# Patient Record
Sex: Female | Born: 1940 | Race: White | Hispanic: No | Marital: Married | Smoking: Former smoker
Health system: Southern US, Community
[De-identification: ages and names within clinical notes are randomized; demographics above are authoritative.]

## PROBLEM LIST (undated history)

## (undated) DIAGNOSIS — E785 Hyperlipidemia, unspecified: Secondary | ICD-10-CM

## (undated) DIAGNOSIS — I10 Essential (primary) hypertension: Secondary | ICD-10-CM

## (undated) DIAGNOSIS — R269 Unspecified abnormalities of gait and mobility: Secondary | ICD-10-CM

## (undated) DIAGNOSIS — R270 Ataxia, unspecified: Secondary | ICD-10-CM

## (undated) DIAGNOSIS — M1990 Unspecified osteoarthritis, unspecified site: Secondary | ICD-10-CM

## (undated) DIAGNOSIS — F482 Pseudobulbar affect: Secondary | ICD-10-CM

## (undated) DIAGNOSIS — F419 Anxiety disorder, unspecified: Secondary | ICD-10-CM

## (undated) DIAGNOSIS — T7840XA Allergy, unspecified, initial encounter: Secondary | ICD-10-CM

## (undated) DIAGNOSIS — R011 Cardiac murmur, unspecified: Secondary | ICD-10-CM

## (undated) DIAGNOSIS — G709 Myoneural disorder, unspecified: Secondary | ICD-10-CM

## (undated) HISTORY — PX: BREAST SURGERY: SHX581

## (undated) HISTORY — DX: Myoneural disorder, unspecified: G70.9

## (undated) HISTORY — DX: Anxiety disorder, unspecified: F41.9

## (undated) HISTORY — DX: Hyperlipidemia, unspecified: E78.5

## (undated) HISTORY — DX: Unspecified osteoarthritis, unspecified site: M19.90

## (undated) HISTORY — DX: Pseudobulbar affect: F48.2

## (undated) HISTORY — PX: CHOLECYSTECTOMY: SHX55

## (undated) HISTORY — DX: Cardiac murmur, unspecified: R01.1

## (undated) HISTORY — DX: Allergy, unspecified, initial encounter: T78.40XA

## (undated) HISTORY — PX: GALLBLADDER SURGERY: SHX652

## (undated) HISTORY — DX: Ataxia, unspecified: R27.0

## (undated) HISTORY — DX: Unspecified abnormalities of gait and mobility: R26.9

## (undated) HISTORY — DX: Essential (primary) hypertension: I10

---

## 1998-05-03 ENCOUNTER — Other Ambulatory Visit: Admission: RE | Admit: 1998-05-03 | Discharge: 1998-05-03 | Payer: Self-pay | Admitting: Obstetrics and Gynecology

## 1998-06-08 ENCOUNTER — Other Ambulatory Visit: Admission: RE | Admit: 1998-06-08 | Discharge: 1998-06-08 | Payer: Self-pay | Admitting: Radiology

## 1999-04-25 ENCOUNTER — Other Ambulatory Visit: Admission: RE | Admit: 1999-04-25 | Discharge: 1999-04-25 | Payer: Self-pay | Admitting: Obstetrics and Gynecology

## 2000-08-11 ENCOUNTER — Other Ambulatory Visit: Admission: RE | Admit: 2000-08-11 | Discharge: 2000-08-11 | Payer: Self-pay | Admitting: Family Medicine

## 2001-08-12 ENCOUNTER — Other Ambulatory Visit: Admission: RE | Admit: 2001-08-12 | Discharge: 2001-08-12 | Payer: Self-pay | Admitting: Family Medicine

## 2001-09-02 LAB — FECAL OCCULT BLOOD, GUAIAC: Fecal Occult Blood: NEGATIVE

## 2002-08-26 ENCOUNTER — Other Ambulatory Visit: Admission: RE | Admit: 2002-08-26 | Discharge: 2002-08-26 | Payer: Self-pay | Admitting: Obstetrics and Gynecology

## 2002-09-01 LAB — HM DEXA SCAN: HM Dexa Scan: NORMAL

## 2003-10-03 ENCOUNTER — Other Ambulatory Visit: Admission: RE | Admit: 2003-10-03 | Discharge: 2003-10-03 | Payer: Self-pay | Admitting: Family Medicine

## 2003-11-08 LAB — HM COLONOSCOPY: HM Colonoscopy: NORMAL

## 2004-12-11 ENCOUNTER — Other Ambulatory Visit: Admission: RE | Admit: 2004-12-11 | Discharge: 2004-12-11 | Payer: Self-pay | Admitting: Family Medicine

## 2004-12-11 ENCOUNTER — Ambulatory Visit: Payer: Self-pay | Admitting: Family Medicine

## 2005-03-21 ENCOUNTER — Ambulatory Visit: Payer: Self-pay | Admitting: Family Medicine

## 2005-07-23 ENCOUNTER — Ambulatory Visit: Payer: Self-pay | Admitting: Family Medicine

## 2006-04-09 ENCOUNTER — Ambulatory Visit: Payer: Self-pay | Admitting: Family Medicine

## 2006-04-16 ENCOUNTER — Ambulatory Visit: Payer: Self-pay | Admitting: Family Medicine

## 2006-05-09 ENCOUNTER — Ambulatory Visit: Payer: Self-pay | Admitting: Family Medicine

## 2006-06-23 ENCOUNTER — Ambulatory Visit: Payer: Self-pay | Admitting: Family Medicine

## 2006-08-05 ENCOUNTER — Ambulatory Visit: Payer: Self-pay | Admitting: Family Medicine

## 2006-10-09 ENCOUNTER — Ambulatory Visit: Payer: Self-pay | Admitting: Family Medicine

## 2006-10-28 ENCOUNTER — Ambulatory Visit: Payer: Self-pay | Admitting: Family Medicine

## 2006-11-06 ENCOUNTER — Encounter: Payer: Self-pay | Admitting: Family Medicine

## 2006-12-17 ENCOUNTER — Ambulatory Visit: Payer: Self-pay | Admitting: Family Medicine

## 2006-12-17 LAB — CONVERTED CEMR LAB
Cholesterol: 263 mg/dL (ref 0–200)
HDL: 59.6 mg/dL (ref >39.0)
Total CHOL/HDL Ratio: 4.4
Triglycerides: 121 mg/dL (ref 0–149)
VLDL: 24 mg/dL (ref 0–40)

## 2007-03-16 ENCOUNTER — Encounter: Payer: Self-pay | Admitting: Family Medicine

## 2007-03-16 DIAGNOSIS — I10 Essential (primary) hypertension: Secondary | ICD-10-CM | POA: Insufficient documentation

## 2007-03-16 DIAGNOSIS — J309 Allergic rhinitis, unspecified: Secondary | ICD-10-CM | POA: Insufficient documentation

## 2007-03-16 DIAGNOSIS — D259 Leiomyoma of uterus, unspecified: Secondary | ICD-10-CM | POA: Insufficient documentation

## 2007-03-16 DIAGNOSIS — Z8679 Personal history of other diseases of the circulatory system: Secondary | ICD-10-CM | POA: Insufficient documentation

## 2007-03-16 DIAGNOSIS — N6019 Diffuse cystic mastopathy of unspecified breast: Secondary | ICD-10-CM

## 2007-03-16 DIAGNOSIS — E7800 Pure hypercholesterolemia, unspecified: Secondary | ICD-10-CM

## 2007-04-28 ENCOUNTER — Ambulatory Visit: Payer: Self-pay | Admitting: Family Medicine

## 2007-04-28 DIAGNOSIS — F329 Major depressive disorder, single episode, unspecified: Secondary | ICD-10-CM

## 2007-06-02 ENCOUNTER — Ambulatory Visit: Payer: Self-pay | Admitting: Family Medicine

## 2007-06-09 LAB — CONVERTED CEMR LAB
Chloride: 110 meq/L (ref 96–112)
Cholesterol: 273 mg/dL (ref 0–200)
GFR calc Af Amer: 109 mL/min
Glucose, Bld: 85 mg/dL (ref 70–99)
HDL: 56.8 mg/dL (ref >39.0)
Phosphorus: 2.7 mg/dL (ref 2.3–4.6)
Sodium: 142 meq/L (ref 135–145)
Total CHOL/HDL Ratio: 4.8
Triglycerides: 137 mg/dL (ref 0–149)
VLDL: 27 mg/dL (ref 0–40)

## 2007-06-11 ENCOUNTER — Encounter: Admission: RE | Admit: 2007-06-11 | Discharge: 2007-08-05 | Payer: Self-pay | Admitting: Family Medicine

## 2007-08-05 ENCOUNTER — Encounter: Payer: Self-pay | Admitting: Family Medicine

## 2007-08-27 ENCOUNTER — Encounter: Payer: Self-pay | Admitting: Family Medicine

## 2007-09-04 ENCOUNTER — Encounter (INDEPENDENT_AMBULATORY_CARE_PROVIDER_SITE_OTHER): Payer: Self-pay | Admitting: *Deleted

## 2008-02-24 ENCOUNTER — Telehealth: Payer: Self-pay | Admitting: Family Medicine

## 2008-06-07 ENCOUNTER — Ambulatory Visit: Payer: Self-pay | Admitting: Family Medicine

## 2008-06-07 DIAGNOSIS — H6120 Impacted cerumen, unspecified ear: Secondary | ICD-10-CM

## 2008-06-15 ENCOUNTER — Encounter: Payer: Self-pay | Admitting: Family Medicine

## 2008-06-29 ENCOUNTER — Ambulatory Visit: Payer: Self-pay | Admitting: Family Medicine

## 2008-06-29 DIAGNOSIS — E079 Disorder of thyroid, unspecified: Secondary | ICD-10-CM | POA: Insufficient documentation

## 2008-07-15 ENCOUNTER — Encounter: Payer: Self-pay | Admitting: Family Medicine

## 2008-07-29 ENCOUNTER — Encounter: Payer: Self-pay | Admitting: Family Medicine

## 2008-08-24 ENCOUNTER — Ambulatory Visit: Payer: Self-pay | Admitting: Family Medicine

## 2008-08-26 LAB — CONVERTED CEMR LAB
AST: 30 units/L (ref 0–37)
Cholesterol: 254 mg/dL (ref 0–200)
Direct LDL: 194.1 mg/dL
Total CHOL/HDL Ratio: 4.7
VLDL: 21 mg/dL (ref 0–40)

## 2008-08-31 ENCOUNTER — Ambulatory Visit: Payer: Self-pay | Admitting: Family Medicine

## 2008-09-19 ENCOUNTER — Encounter: Payer: Self-pay | Admitting: Family Medicine

## 2008-10-04 ENCOUNTER — Ambulatory Visit: Payer: Self-pay | Admitting: Family Medicine

## 2008-10-06 LAB — CONVERTED CEMR LAB
AST: 29 units/L (ref 0–37)
HDL: 52.5 mg/dL (ref >39.0)
Total CHOL/HDL Ratio: 4.4
VLDL: 27 mg/dL (ref 0–40)

## 2008-11-15 ENCOUNTER — Ambulatory Visit: Payer: Self-pay | Admitting: Family Medicine

## 2008-11-15 DIAGNOSIS — M79609 Pain in unspecified limb: Secondary | ICD-10-CM | POA: Insufficient documentation

## 2008-12-06 ENCOUNTER — Ambulatory Visit: Payer: Self-pay | Admitting: Family Medicine

## 2008-12-06 ENCOUNTER — Encounter: Payer: Self-pay | Admitting: Critical Care Medicine

## 2008-12-06 DIAGNOSIS — R0602 Shortness of breath: Secondary | ICD-10-CM | POA: Insufficient documentation

## 2008-12-26 ENCOUNTER — Encounter: Payer: Self-pay | Admitting: Family Medicine

## 2009-01-04 ENCOUNTER — Ambulatory Visit: Payer: Self-pay | Admitting: Family Medicine

## 2009-01-04 DIAGNOSIS — E559 Vitamin D deficiency, unspecified: Secondary | ICD-10-CM

## 2009-01-10 ENCOUNTER — Ambulatory Visit: Payer: Self-pay | Admitting: Critical Care Medicine

## 2009-01-17 ENCOUNTER — Ambulatory Visit: Payer: Self-pay | Admitting: Critical Care Medicine

## 2009-01-18 ENCOUNTER — Encounter: Payer: Self-pay | Admitting: Critical Care Medicine

## 2009-01-19 ENCOUNTER — Encounter: Payer: Self-pay | Admitting: Critical Care Medicine

## 2009-04-28 ENCOUNTER — Telehealth: Payer: Self-pay | Admitting: Family Medicine

## 2009-08-29 ENCOUNTER — Ambulatory Visit: Payer: Self-pay | Admitting: Family Medicine

## 2009-08-30 LAB — CONVERTED CEMR LAB
Albumin: 4.1 g/dL (ref 3.5–5.2)
Calcium: 9.9 mg/dL (ref 8.4–10.5)
Chloride: 106 meq/L (ref 96–112)
Cholesterol: 222 mg/dL — ABNORMAL HIGH (ref 0–200)
Direct LDL: 154.9 mg/dL
Free T4: 0.6 ng/dL (ref 0.6–1.6)
Potassium: 4 meq/L (ref 3.5–5.1)
Triglycerides: 173 mg/dL — ABNORMAL HIGH (ref 0.0–149.0)

## 2009-09-03 ENCOUNTER — Inpatient Hospital Stay: Payer: Self-pay | Admitting: Surgery

## 2009-09-06 ENCOUNTER — Telehealth: Payer: Self-pay | Admitting: Family Medicine

## 2009-09-20 ENCOUNTER — Encounter: Payer: Self-pay | Admitting: Family Medicine

## 2009-09-26 ENCOUNTER — Encounter: Payer: Self-pay | Admitting: Family Medicine

## 2009-10-02 ENCOUNTER — Encounter: Payer: Self-pay | Admitting: Family Medicine

## 2010-08-28 ENCOUNTER — Ambulatory Visit: Payer: Self-pay | Admitting: Family Medicine

## 2010-08-29 LAB — CONVERTED CEMR LAB
ALT: 28 units/L (ref 0–35)
BUN: 17 mg/dL (ref 6–23)
Basophils Relative: 0.4 % (ref 0.0–3.0)
CO2: 24 meq/L (ref 19–32)
Calcium: 9.7 mg/dL (ref 8.4–10.5)
Cholesterol: 234 mg/dL — ABNORMAL HIGH (ref 0–200)
Creatinine, Ser: 0.8 mg/dL (ref 0.4–1.2)
Direct LDL: 155.2 mg/dL
Eosinophils Relative: 1.6 % (ref 0.0–5.0)
HCT: 41.3 % (ref 36.0–46.0)
Hemoglobin: 13.8 g/dL (ref 12.0–15.0)
Lymphs Abs: 2.8 10*3/uL (ref 0.7–4.0)
MCV: 91.8 fL (ref 78.0–100.0)
Monocytes Absolute: 0.7 10*3/uL (ref 0.1–1.0)
Neutro Abs: 4.7 10*3/uL (ref 1.4–7.7)
Platelets: 240 10*3/uL (ref 150.0–400.0)
Sodium: 140 meq/L (ref 135–145)
TSH: 3.48 microintl units/mL (ref 0.35–5.50)
Total Bilirubin: 0.6 mg/dL (ref 0.3–1.2)
Total Protein: 7.2 g/dL (ref 6.0–8.3)
Triglycerides: 162 mg/dL — ABNORMAL HIGH (ref 0.0–149.0)
Vit D, 25-Hydroxy: 58 ng/mL (ref 30–89)
WBC: 8.3 10*3/uL (ref 4.5–10.5)

## 2010-09-20 ENCOUNTER — Encounter: Payer: Self-pay | Admitting: Family Medicine

## 2010-09-25 ENCOUNTER — Encounter: Payer: Self-pay | Admitting: Family Medicine

## 2010-09-25 LAB — HM MAMMOGRAPHY: HM Mammogram: NORMAL

## 2010-09-28 ENCOUNTER — Encounter: Payer: Self-pay | Admitting: Family Medicine

## 2010-12-30 LAB — CONVERTED CEMR LAB
Basophils Absolute: 0 10*3/uL (ref 0.0–0.1)
Eosinophils Absolute: 0.2 10*3/uL (ref 0.0–0.7)
MCHC: 34.4 g/dL (ref 30.0–36.0)
MCV: 91.2 fL (ref 78.0–100.0)
Neutrophils Relative %: 51.8 % (ref 43.0–77.0)
Platelets: 278 10*3/uL (ref 150–400)
WBC: 7.3 10*3/uL (ref 4.5–10.5)

## 2011-01-01 NOTE — Assessment & Plan Note (Signed)
Summary: F/U,REFILL MEDICATIONS/CLE   Vital Signs:  Patient profile:   70 year old female Height:      64 inches Weight:      189.25 pounds BMI:     32.60 Temp:     97.9 degrees F oral Pulse rate:   76 / minute Pulse rhythm:   regular BP sitting:   132 / 76  (left arm) Cuff size:   regular  Vitals Entered By: Lewanda Rife LPN (August 28, 2010 9:19 AM) CC: refill meds   History of Present Illness: here for f/u of HTN and low D and thyroid and lipids   is feeling pretty good  neurologic process is progressive  is able to walk with a cane -- no walker yet  thinks she probably needs one -- does occ fall  especially getting out of house   went to [facility] -- they offered nothing in terms of her condition   wt is up 1 lb  132/76 bp today   on welchol- cannot have statins last LDL 150s- needs re check  remote hx abn tsh  has mail order form for her meds   is cooking more- may be bad for cholesterol     Allergies (verified): No Known Drug Allergies  Past History:  Past Medical History: Last updated: 08/31/2008 Allergic rhinitis-seasonal Hypertension degenerative cerebellar ataxia hyperlipidemia  neurology---Dr Hollice Espy ([place]), Dr Orlin Hilding  Past Surgical History: Last updated: 10/04/2009 Left breast needle biopsy- neg. Dexa- normal (1999), Dexa- normal (09/2002) Carotid US- normal (09/2003) Colonoscopy- hemorrhoids, otherwise neg. (11/2003) NCV/EMG- normal (09/2005) 2D Echo- neg. (04/2006) 10/10 CCY  Family History: Last updated: 01/10/2009 father CAD mother COPD, breast ca daughter- depression MGM with CHF  Family History Lung Cancer father  Social History: Last updated: 08/28/2010 married Patient is a former smoker.  no alcohol  has an exercise bike-- does not use it often  retired from [employer] enjoys cooking  Risk Factors: Smoking Status: quit (03/16/2007)  Social History: married Patient is a former smoker.  no  alcohol  has an exercise bike-- does not use it often  retired from [employer] enjoys cooking  Review of Systems General:  Complains of fatigue; denies fever, loss of appetite, and malaise. Eyes:  Denies blurring and eye irritation. CV:  Denies chest pain or discomfort and palpitations. Resp:  Denies cough, shortness of breath, and wheezing. GI:  Denies abdominal pain, bloody stools, change in bowel habits, and indigestion. GU:  Denies dysuria and urinary frequency. MS:  Complains of muscle weakness and stiffness; denies cramps. Derm:  Denies itching, lesion(s), poor wound healing, and rash. Neuro:  Denies numbness and tingling. Psych:  mood is fair . Endo:  Denies cold intolerance, excessive thirst, excessive urination, and heat intolerance. Heme:  Denies abnormal bruising and bleeding.  Physical Exam  General:  overweight but generally well appearing  Head:  normocephalic, atraumatic, and no abnormalities observed.   Eyes:  vision grossly intact, pupils equal, pupils round, and pupils reactive to light.  no conjunctival pallor, injection or icterus  Ears:  R ear normal and L ear normal.   Nose:  no nasal discharge.   Mouth:  pharynx pink and moist.   Neck:  supple with full rom and no masses or thyromegally, no JVD or carotid bruit  Chest Wall:  No deformities, masses, or tenderness noted. Lungs:  Normal respiratory effort, chest expands symmetrically. Lungs are clear to auscultation, no crackles or wheezes. Heart:  Normal rate and regular  rhythm. S1 and S2 normal without gallop, murmur, click, rub or other extra sounds. Abdomen:  Bowel sounds positive,abdomen soft and non-tender without masses, organomegaly or hernias noted. no renal bruits  Msk:  No deformity or scoliosis noted of thoracic or lumbar spine.  no acute joint changes  Pulses:  pulses normal Extremities:  no clubbing, cyanosis, edema, or deformity noted Neurologic:  some speech slurring today general ataxia  - with cane  no tremor noted  Skin:  Intact without suspicious lesions or rashes Cervical Nodes:  No lymphadenopathy noted Inguinal Nodes:  No significant adenopathy Psych:  normal affect, talkative and pleasant    Impression & Recommendations:  Problem # 1:  THYROID STIMULATING HORMONE, ABNORMAL (ICD-246.9) Assessment Unchanged re check tsh- last few have been nl  no clinical change Orders: Venipuncture (81191) TLB-Lipid Panel (80061-LIPID) TLB-Renal Function Panel (80069-RENAL) TLB-CBC Platelet - w/Differential (85025-CBCD) TLB-Hepatic/Liver Function Pnl (80076-HEPATIC) TLB-TSH (Thyroid Stimulating Hormone) (84443-TSH) T-Vitamin D (25-Hydroxy) (47829-56213)  Problem # 2:  UNSPECIFIED VITAMIN D DEFICIENCY (ICD-268.9) Assessment: Unchanged check level on current dose Orders: Venipuncture (08657) TLB-Lipid Panel (80061-LIPID) TLB-Renal Function Panel (80069-RENAL) TLB-CBC Platelet - w/Differential (85025-CBCD) TLB-Hepatic/Liver Function Pnl (80076-HEPATIC) TLB-TSH (Thyroid Stimulating Hormone) (84443-TSH) T-Vitamin D (25-Hydroxy) (84696-29528) Specimen Handling (41324)  Problem # 3:  ATAXIA, 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) Assessment: Deteriorated continue neuro f/u needs a walker for safety  Problem # 4:  Hx of HYPERCHOLESTEROLEMIA (ICD-272.0) Assessment: Unchanged  may be up due to cooking more rev low sat fat diet lab today Her updated medication list for this problem includes:    Welchol 625 Mg Tabs (Colesevelam hcl) .Marland KitchenMarland KitchenMarland KitchenMarland Kitchen 3 by mouth two times a day as directed  Orders: Venipuncture (40102) TLB-Lipid Panel (80061-LIPID) TLB-Renal Function Panel (80069-RENAL) TLB-CBC Platelet - w/Differential (85025-CBCD) TLB-Hepatic/Liver Function Pnl (80076-HEPATIC) TLB-TSH (Thyroid Stimulating Hormone) (84443-TSH) T-Vitamin D (25-Hydroxy) (72536-64403)  Labs Reviewed: SGOT: 29 (08/29/2009)   SGPT: 38 (08/29/2009)   HDL:50.30 (08/29/2009), 52.5 (10/04/2008)  LDL:153  (10/04/2008), DEL (08/24/2008)  Chol:222 (08/29/2009), 232 (10/04/2008)  Trig:173.0 (08/29/2009), 133 (10/04/2008)  Problem # 5:  HYPERTENSION (ICD-401.9) Assessment: Unchanged  bp is good - no change in med  Her updated medication list for this problem includes:    Hydrochlorothiazide 25 Mg Tabs (Hydrochlorothiazide) .Marland Kitchen... 1 by mouth once daily    Toprol Xl 50 Mg Tb24 (Metoprolol succinate) .Marland Kitchen... 1/2 by mouth once daily  Orders: Venipuncture (47425) TLB-Lipid Panel (80061-LIPID) TLB-Renal Function Panel (80069-RENAL) TLB-CBC Platelet - w/Differential (85025-CBCD) TLB-Hepatic/Liver Function Pnl (80076-HEPATIC) TLB-TSH (Thyroid Stimulating Hormone) (84443-TSH) T-Vitamin D (25-Hydroxy) (95638-75643)  BP today: 132/76 Prior BP: 130/90 (08/29/2009)  Labs Reviewed: K+: 4.0 (08/29/2009) Creat: : 0.8 (08/29/2009)   Chol: 222 (08/29/2009)   HDL: 50.30 (08/29/2009)   LDL: 153 (10/04/2008)   TG: 173.0 (08/29/2009)  Complete Medication List: 1)  Hydrochlorothiazide 25 Mg Tabs (Hydrochlorothiazide) .Marland Kitchen.. 1 by mouth once daily 2)  Toprol Xl 50 Mg Tb24 (Metoprolol succinate) .... 1/2 by mouth once daily 3)  Adult Aspirin Low Strength 81 Mg Tbdp (Aspirin) .... As needed 4)  Flax Seed Oil 1000 Mg Caps (Flaxseed (linseed)) .... Two capsules by mouth daily 5)  Welchol 625 Mg Tabs (Colesevelam hcl) .... 3 by mouth two times a day as directed 6)  Vitamin D 1000 Unit Tabs (Cholecalciferol) .... Take 1 tablet by mouth twice a day  Patient Instructions: 1)  ask your neurologist if it is safe for you to get a flu shot  2)  refilling meds  3)  labs today  4)  try  to stay active - I think a walker would increase your independence and ability to do things as well as speed  Prescriptions: WELCHOL 625 MG TABS (COLESEVELAM HCL) 3 by mouth two times a day as directed  # 3 months x 3   Entered and Authorized by:   Judith Part MD   Signed by:   Judith Part MD on 08/28/2010   Method used:    Historical   RxID:   [id number] TOPROL XL 50 MG  TB24 (METOPROLOL SUCCINATE) 1/2 by mouth once daily  #45 x 3   Entered and Authorized by:   Judith Part MD   Signed by:   Judith Part MD on 08/28/2010   Method used:   Historical   RxID:   [id number] HYDROCHLOROTHIAZIDE 25 MG TABS (HYDROCHLOROTHIAZIDE) 1 by mouth once daily  #90 x 3   Entered and Authorized by:   Judith Part MD   Signed by:   Judith Part MD on 08/28/2010   Method used:   Historical   RxID:   [id number]   Current Allergies (reviewed today): No known allergies

## 2011-01-01 NOTE — Medication Information (Signed)
Summary: Mail Order Rx's For Welchol, HCTZ, Toprol XL  Mail Order Rx's For Welchol, HCTZ, Toprol XL   Imported By: Maryln Gottron 09/03/2010 11:15:36  _____________________________________________________________________  External Attachment:    Type:   Image     Comment:   External Document

## 2011-01-01 NOTE — Letter (Signed)
Summary: Results Follow up Letter  [facility]  [address] [place], [place] [address]   Phone: [phone]  Fax: [phone]    09/28/2010 MRN: [id number]    Jaime Mccarthy [address] [place], [place]  [address]    Dear Ms. Caryn Section,  The following are the results of your recent test(s):  Test         Result    Pap Smear:        Normal _____  Not Normal _____ Comments: ______________________________________________________ Cholesterol: LDL(Bad cholesterol):         Your goal is less than:         HDL (Good cholesterol):       Your goal is more than: Comments:  ______________________________________________________ Mammogram:        Normal __X___  Not Normal _____ Comments:Repeat in one year.    ___________________________________________________________________ Hemoccult:        Normal _____  Not normal _______ Comments:    _____________________________________________________________________ Other Tests:    We routinely do not discuss normal results over the telephone.  If you desire a copy of the results, or you have any questions about this information we can discuss them at your next office visit.   Sincerely,    Idamae Schuller Tower,MD  MT/ri

## 2011-01-01 NOTE — Letter (Signed)
Summary: [facility] @ [facility] [place]   Imported By: Lanelle Bal 09/27/2010 13:07:50  _____________________________________________________________________  External Attachment:    Type:   Image     Comment:   External Document

## 2011-09-10 ENCOUNTER — Telehealth: Payer: Self-pay | Admitting: *Deleted

## 2011-09-10 NOTE — Telephone Encounter (Signed)
Thanks for the heads up -will address at f/u- please make sure pt is feeling ok - no headache or new symptoms thanks

## 2011-09-10 NOTE — Telephone Encounter (Signed)
Tried to call pt and line busy x 2. Will try again.

## 2011-09-10 NOTE — Telephone Encounter (Signed)
Thanks- I will route this to Jamesetta So to see why I got the message so late -- I did not see it until late on 10/9

## 2011-09-10 NOTE — Telephone Encounter (Signed)
Spoke with pt's husband and he said pt is doing OK today. He said speech therapist called our office on 09/09/11. I apologized and explained that note came to Dr Milinda Antis today at 4:05pm. Mr Atha said pt's pressure today diastolic was 80. Pt is not having h/a and feels OK. Pt will keep appt to see Dr Milinda Antis and will call back sooner if needed.

## 2011-09-10 NOTE — Telephone Encounter (Signed)
FYI----Speech therapist called to report that when she was with pt yesterday pt's BP was 140/100, which is unusual for the patient.  She has appt to see you on 10/23.

## 2011-09-13 NOTE — Telephone Encounter (Signed)
Called and spoke to pt husband and when I asked him what day the therapist was out to check wife's BP he first said without hesitation Tuesday and when we started talking about the situation he said it was on Monday.  He said the therapist called from his house and left a message that afternoon.  Seems to be inconsistencies on when the therapist was at the house; however, pt husband is saying it was on Monday.  I will follow up with [facility] also to research what day the therapist was at the home and reported BP.  Husband explained that they continued to monitor blood pressure and that it came down and that his wife was doing ok so they did not call back.  I apologized several times and explained that our office notes calls into the office and calls back and I would research further to assure it did not happen again.    Of note, documentation on chart from nursing staff also is dated on Tuesday and says the therapist called on that day.  I will also follow up with [facility] regarding our communication.

## 2011-09-14 NOTE — Telephone Encounter (Signed)
Thanks -- just need to make sure if there was a delay that it does not happen again

## 2011-09-23 ENCOUNTER — Encounter: Payer: Self-pay | Admitting: Family Medicine

## 2011-09-24 ENCOUNTER — Ambulatory Visit (INDEPENDENT_AMBULATORY_CARE_PROVIDER_SITE_OTHER): Payer: Medicare PPO | Admitting: Family Medicine

## 2011-09-24 ENCOUNTER — Encounter: Payer: Self-pay | Admitting: Family Medicine

## 2011-09-24 VITALS — BP 128/70 | HR 76 | Temp 97.9°F | Ht 64.0 in | Wt 188.8 lb

## 2011-09-24 DIAGNOSIS — G119 Hereditary ataxia, unspecified: Secondary | ICD-10-CM

## 2011-09-24 DIAGNOSIS — E559 Vitamin D deficiency, unspecified: Secondary | ICD-10-CM

## 2011-09-24 DIAGNOSIS — I10 Essential (primary) hypertension: Secondary | ICD-10-CM

## 2011-09-24 DIAGNOSIS — E7800 Pure hypercholesterolemia, unspecified: Secondary | ICD-10-CM

## 2011-09-24 LAB — COMPREHENSIVE METABOLIC PANEL
ALT: 55 U/L — ABNORMAL HIGH (ref 0–35)
AST: 45 U/L — ABNORMAL HIGH (ref 0–37)
Alkaline Phosphatase: 81 U/L (ref 39–117)
CO2: 28 mEq/L (ref 19–32)
GFR: 79.26 mL/min (ref >60.00)
Sodium: 139 mEq/L (ref 135–145)
Total Bilirubin: 0.5 mg/dL (ref 0.3–1.2)
Total Protein: 8.1 g/dL (ref 6.0–8.3)

## 2011-09-24 LAB — LIPID PANEL
Cholesterol: 227 mg/dL — ABNORMAL HIGH (ref 0–200)
HDL: 66.4 mg/dL (ref >39.00)
Total CHOL/HDL Ratio: 3
Triglycerides: 118 mg/dL (ref 0.0–149.0)
VLDL: 23.6 mg/dL (ref 0.0–40.0)

## 2011-09-24 LAB — CBC WITH DIFFERENTIAL/PLATELET
Basophils Absolute: 0 10*3/uL (ref 0.0–0.1)
Eosinophils Absolute: 0.1 10*3/uL (ref 0.0–0.7)
HCT: 44.1 % (ref 36.0–46.0)
Lymphs Abs: 3 10*3/uL (ref 0.7–4.0)
MCHC: 33.7 g/dL (ref 30.0–36.0)
Monocytes Relative: 7.2 % (ref 3.0–12.0)
Platelets: 284 10*3/uL (ref 150.0–400.0)
RDW: 15.5 % — ABNORMAL HIGH (ref 11.5–14.6)

## 2011-09-24 LAB — LDL CHOLESTEROL, DIRECT: Direct LDL: 141.9 mg/dL

## 2011-09-24 MED ORDER — HYDROCHLOROTHIAZIDE 25 MG PO TABS: 25.0000 mg | ORAL_TABLET | Freq: Every day | ORAL | Status: DC

## 2011-09-24 MED ORDER — COLESEVELAM HCL 625 MG PO TABS: 1250.0000 mg | ORAL_TABLET | Freq: Three times a day (TID) | ORAL | Status: DC

## 2011-09-24 MED ORDER — METOPROLOL SUCCINATE ER 50 MG PO TB24: 25.0000 mg | ORAL_TABLET | Freq: Every day | ORAL | Status: DC

## 2011-09-24 NOTE — Patient Instructions (Signed)
Ask your neurologist about the safety of getting flu shot and later shingles shot  We will fax in your medicine referrals  Avoid red meat/ fried foods/ egg yolks/ fatty breakfast meats/ butter, cheese and high fat dairy/ and shellfish   Labs today

## 2011-09-24 NOTE — Progress Notes (Signed)
Subjective:    Patient ID: Jaime Mccarthy, female    DOB: [birth year], 70 y.o.   MRN: [id number]  HPI Here for f/u of HTN and hyperlipidemia and rev other health probs Is doing well overall   Cerebellar ataxia- still progressing somewhat - overall doing well and appreciative for that Using a cane to walk  Speech is very slow-- does speech and physical therapy-- does lots of exercises  Sees neuro- Dr Hollice Espy  HTN bp today is 140/88-- just walked in - on first check  A few weeks ago had one high reading 160/100- very unusual for her , then it came down (? Was on a cold med)  She continued to monitor it - is usually 120s-140/70s  No cp or ha or edema On 2 med   Chol- on wellchol  Lab Results  Component Value Date   CHOL 234* 08/28/2010   HDL 55.20 08/28/2010   LDLCALC 153* 10/04/2008   LDLDIRECT 155.2 08/28/2010   TRIG 162.0* 08/28/2010   CHOLHDL 4 08/28/2010   due for a check - has not had it checked elsewhere  Diet - eats too much - and does not watch it - just not motivated   Wt stable with bmi of 32  Has her mam sched nov the 2nd   Patient Active Problem List  Diagnoses  . FIBROIDS, UTERUS  . THYROID STIMULATING HORMONE, ABNORMAL  . UNSPECIFIED VITAMIN D DEFICIENCY  . HYPERCHOLESTEROLEMIA  . DISORDER, DEPRESSIVE NEC  . ATAXIA, CEREBELLAR NEC  . CERUMEN IMPACTION, RIGHT  . HYPERTENSION  . ALLERGIC RHINITIS  . FIBROCYSTIC BREAST DISEASE  . HEEL PAIN, RIGHT  . DYSPNEA  . HEART MURMUR, HX OF   Past Medical History  Diagnosis Date  . Allergy     allergic rhinitis seasonal  . Hypertension   . Hyperlipidemia   . Ataxia     degenerative cerebellar   Past Surgical History  Procedure Date  . Breast surgery     left breast needle biopsy negative   History  Substance Use Topics  . Smoking status: Former Games developer  . Smokeless tobacco: Not on file  . Alcohol Use: No   Family History  Problem Relation Age of Onset  . COPD Mother   . Cancer Mother     breast Ca  .  Heart disease Father     CAD  . Cancer Father     lung CA  . Depression Daughter   . Heart disease Maternal Grandmother     heart failure   No Known Allergies Current Outpatient Prescriptions on File Prior to Visit  Medication Sig Dispense Refill  . aspirin 81 MG tablet Take 81 mg by mouth daily as needed.        . cholecalciferol (VITAMIN D) 1000 UNITS tablet Take 1,000 Units by mouth 2 (two) times daily.        . Flaxseed, Linseed, (FLAX SEED OIL) 1000 MG CAPS Take 2 capsules by mouth daily.             Review of Systems Review of Systems  Constitutional: Negative for fever, appetite change, fatigue and unexpected weight change.  Eyes: Negative for pain and visual disturbance.  Respiratory: Negative for cough and shortness of breath.   Cardiovascular: Negative for cp or palpitations    Gastrointestinal: Negative for nausea, diarrhea and constipation.  Genitourinary: Negative for urgency and frequency.  Skin: Negative for pallor or rash   Neurological: Negative for, light-headedness, numbness and headaches.  pos for weakness and poor balance  Hematological: Negative for adenopathy. Does not bruise/bleed easily.  Psychiatric/Behavioral: Negative for dysphoric mood. The patient is not nervous/anxious.          Objective:   Physical Exam  Constitutional: She appears well-developed and well-nourished.       overwt and well appearing   HENT:  Head: Normocephalic and atraumatic.  Right Ear: External ear normal.  Left Ear: External ear normal.  Mouth/Throat: Oropharynx is clear and moist.  Eyes: Conjunctivae and EOM are normal. Pupils are equal, round, and reactive to light.  Neck: Normal range of motion. Neck supple. No JVD present. Carotid bruit is not present. No tracheal deviation present. No thyromegaly present.  Cardiovascular: Normal rate, regular rhythm and normal heart sounds.  Exam reveals no gallop.   Pulmonary/Chest: Effort normal and breath sounds normal. No  respiratory distress. She has no wheezes. She exhibits no tenderness.  Abdominal: Soft. Bowel sounds are normal. She exhibits no distension, no abdominal bruit and no mass. There is no tenderness.  Musculoskeletal: Normal range of motion. She exhibits no edema and no tenderness.  Lymphadenopathy:    She has no cervical adenopathy.  Neurological: She is alert. She has normal reflexes. She displays tremor. She exhibits abnormal muscle tone. She displays no seizure activity. Coordination and gait abnormal.       Very off balance with worse ataxia and slowed speech No sensory changes  Skin: Skin is warm and dry. No rash noted. No erythema. No pallor.          Assessment & Plan:

## 2011-09-26 NOTE — Assessment & Plan Note (Signed)
Labs today on welchol Cannot have statins at all  Rev low sat fat diet - it is poor now with poor motivation  Disc imp of this in detail

## 2011-09-26 NOTE — Assessment & Plan Note (Signed)
This is gradually worsening - but pt doing ok- works with PT / OT  Safety is a big issue Will continue neuro f/u Will ask about safety of vaccines also - flu / zoster

## 2011-09-26 NOTE — Assessment & Plan Note (Signed)
bp better on 2nd check  Check at home , some whitecoat component  Disc lifestyle change

## 2011-09-26 NOTE — Assessment & Plan Note (Signed)
Check this today on current supplementation

## 2011-10-07 ENCOUNTER — Encounter: Payer: Self-pay | Admitting: Family Medicine

## 2011-10-09 ENCOUNTER — Encounter: Payer: Self-pay | Admitting: *Deleted

## 2011-10-30 ENCOUNTER — Other Ambulatory Visit: Payer: Self-pay | Admitting: Family Medicine

## 2011-10-30 DIAGNOSIS — R7402 Elevation of levels of lactic acid dehydrogenase (LDH): Secondary | ICD-10-CM

## 2011-10-30 DIAGNOSIS — R7989 Other specified abnormal findings of blood chemistry: Secondary | ICD-10-CM

## 2011-11-05 ENCOUNTER — Other Ambulatory Visit (INDEPENDENT_AMBULATORY_CARE_PROVIDER_SITE_OTHER): Payer: Medicare PPO

## 2011-11-05 LAB — HEPATIC FUNCTION PANEL
ALT: 63 U/L — ABNORMAL HIGH (ref 0–35)
AST: 61 U/L — ABNORMAL HIGH (ref 0–37)
Albumin: 4.3 g/dL (ref 3.5–5.2)
Total Protein: 7.5 g/dL (ref 6.0–8.3)

## 2011-11-15 ENCOUNTER — Other Ambulatory Visit: Payer: Self-pay | Admitting: Family Medicine

## 2011-11-22 ENCOUNTER — Other Ambulatory Visit (INDEPENDENT_AMBULATORY_CARE_PROVIDER_SITE_OTHER): Payer: Medicare PPO

## 2011-11-22 LAB — HEPATIC FUNCTION PANEL
ALT: 67 U/L — ABNORMAL HIGH (ref 0–35)
Total Bilirubin: 0.7 mg/dL (ref 0.3–1.2)

## 2012-02-27 ENCOUNTER — Other Ambulatory Visit: Payer: Self-pay | Admitting: Family Medicine

## 2012-02-27 DIAGNOSIS — E7800 Pure hypercholesterolemia, unspecified: Secondary | ICD-10-CM

## 2012-03-03 ENCOUNTER — Other Ambulatory Visit (INDEPENDENT_AMBULATORY_CARE_PROVIDER_SITE_OTHER): Payer: Medicare PPO

## 2012-03-03 DIAGNOSIS — E7800 Pure hypercholesterolemia, unspecified: Secondary | ICD-10-CM

## 2012-03-03 LAB — LIPID PANEL
Cholesterol: 207 mg/dL — ABNORMAL HIGH (ref 0–200)
Total CHOL/HDL Ratio: 3
Triglycerides: 146 mg/dL (ref 0.0–149.0)
VLDL: 29.2 mg/dL (ref 0.0–40.0)

## 2012-03-03 LAB — LDL CHOLESTEROL, DIRECT: Direct LDL: 133.8 mg/dL

## 2012-03-03 LAB — HEPATIC FUNCTION PANEL
AST: 26 U/L (ref 0–37)
Albumin: 4.4 g/dL (ref 3.5–5.2)

## 2012-03-05 ENCOUNTER — Encounter: Payer: Self-pay | Admitting: *Deleted

## 2012-03-09 ENCOUNTER — Telehealth: Payer: Self-pay

## 2012-03-09 NOTE — Telephone Encounter (Signed)
Jaime Mccarthy requested copy of 09/2011 lab work. Designated party formed signed. Copy of lab 09/24/11 given to Jaime. Crall.

## 2012-09-25 ENCOUNTER — Ambulatory Visit (INDEPENDENT_AMBULATORY_CARE_PROVIDER_SITE_OTHER): Payer: Medicare PPO | Admitting: Family Medicine

## 2012-09-25 ENCOUNTER — Encounter: Payer: Self-pay | Admitting: Family Medicine

## 2012-09-25 VITALS — BP 132/90 | HR 82 | Temp 97.5°F | Ht 64.0 in | Wt 165.8 lb

## 2012-09-25 DIAGNOSIS — E7800 Pure hypercholesterolemia, unspecified: Secondary | ICD-10-CM

## 2012-09-25 DIAGNOSIS — E559 Vitamin D deficiency, unspecified: Secondary | ICD-10-CM

## 2012-09-25 DIAGNOSIS — I10 Essential (primary) hypertension: Secondary | ICD-10-CM

## 2012-09-25 LAB — COMPREHENSIVE METABOLIC PANEL
AST: 25 U/L (ref 0–37)
Alkaline Phosphatase: 92 U/L (ref 39–117)
BUN: 12 mg/dL (ref 6–23)
Glucose, Bld: 83 mg/dL (ref 70–99)
Total Bilirubin: 0.8 mg/dL (ref 0.3–1.2)

## 2012-09-25 LAB — CBC WITH DIFFERENTIAL/PLATELET
Basophils Absolute: 0 10*3/uL (ref 0.0–0.1)
Hemoglobin: 15.2 g/dL — ABNORMAL HIGH (ref 12.0–15.0)
Lymphocytes Relative: 35.8 % (ref 12.0–46.0)
Monocytes Relative: 11.2 % (ref 3.0–12.0)
Neutro Abs: 3.6 10*3/uL (ref 1.4–7.7)
RDW: 14.4 % (ref 11.5–14.6)

## 2012-09-25 LAB — LIPID PANEL
Cholesterol: 222 mg/dL — ABNORMAL HIGH (ref 0–200)
HDL: 59.9 mg/dL (ref >39.00)
Total CHOL/HDL Ratio: 4
Triglycerides: 94 mg/dL (ref 0.0–149.0)
VLDL: 18.8 mg/dL (ref 0.0–40.0)

## 2012-09-25 LAB — TSH: TSH: 2.68 u[IU]/mL (ref 0.35–5.50)

## 2012-09-25 NOTE — Assessment & Plan Note (Signed)
Lab today on 1000 iu daily  No recent falls and no hx of fractures  Disc imp to bone and overall health

## 2012-09-25 NOTE — Assessment & Plan Note (Signed)
bp in fair control at this time  No changes needed  Disc lifstyle change with low sodium diet and exercise   Labs today 

## 2012-09-25 NOTE — Progress Notes (Signed)
Subjective:    Patient ID: Jaime Mccarthy, female    DOB: Feb 26, 1941, 71 y.o.   MRN: [id number]  HPI Here for f/u of chronic medical problems  Sees neurol for chonic deg cellebellar ataxia Not a lot of change- little worse with speech , and shakes a bit  Also has a fancy walker with seat and wheels and brakes  Had follow up yesterday    bp is stable today  No cp or palpitations or headaches or edema  No side effects to medicines  BP Readings from Last 3 Encounters:  09/25/12 132/90  09/24/11 128/70  08/28/10 132/76      Lipids Lab Results  Component Value Date   CHOL 207* 03/03/2012   HDL 60.50 03/03/2012   LDLCALC 153* 10/04/2008   LDLDIRECT 133.8 03/03/2012   TRIG 146.0 03/03/2012   CHOLHDL 3 03/03/2012  needs a check today  On welchol- cannot take statins   Hx of vit d def- due for a check -- takes ? 1000 iu per day  No fractures  No new falls since getting her walker   Flu vaccine-- does usually get but has a cold today  Pneumovax -never had    Wt is down 23 lb  Working really hard on that  Lowered her cholesterol  Also gave up a lot of bread and some sweets  Feels better  bmi 28  Patient Active Problem List  Diagnosis  . FIBROIDS, UTERUS  . THYROID STIMULATING HORMONE, ABNORMAL  . UNSPECIFIED VITAMIN D DEFICIENCY  . HYPERCHOLESTEROLEMIA  . DISORDER, DEPRESSIVE NEC  . ATAXIA, CEREBELLAR NEC  . CERUMEN IMPACTION, RIGHT  . HYPERTENSION  . ALLERGIC RHINITIS  . FIBROCYSTIC BREAST DISEASE  . HEEL PAIN, RIGHT  . DYSPNEA  . HEART MURMUR, HX OF   Past Medical History  Diagnosis Date  . Allergy     allergic rhinitis seasonal  . Hypertension   . Hyperlipidemia   . Ataxia     degenerative cerebellar   Past Surgical History  Procedure Date  . Breast surgery     left breast needle biopsy negative   History  Substance Use Topics  . Smoking status: Former Games developer  . Smokeless tobacco: Not on file  . Alcohol Use: No   Family History  Problem Relation Age of  Onset  . COPD Mother   . Cancer Mother     breast Ca  . Heart disease Father     CAD  . Cancer Father     lung CA  . Depression Daughter   . Heart disease Maternal Grandmother     heart failure   No Known Allergies Current Outpatient Prescriptions on File Prior to Visit  Medication Sig Dispense Refill  . aspirin 81 MG tablet Take 81 mg by mouth daily as needed.        . cholecalciferol (VITAMIN D) 1000 UNITS tablet Take 1,000 Units by mouth 2 (two) times daily.        . colesevelam (WELCHOL) 625 MG tablet Take 2 tablets (1,250 mg total) by mouth 3 (three) times daily after meals.  54 tablet  3  . Flaxseed, Linseed, (FLAX SEED OIL) 1000 MG CAPS Take 2 capsules by mouth daily.        . hydrochlorothiazide (HYDRODIURIL) 25 MG tablet Take 1 tablet (25 mg total) by mouth daily.  90 tablet  3  . metoprolol (TOPROL-XL) 50 MG 24 hr tablet Take 0.5 tablets (25 mg total) by mouth daily.  45 tablet  3      Review of Systems Review of Systems  Constitutional: Negative for fever, appetite change, fatigue and unexpected weight change.  Eyes: Negative for pain and visual disturbance.  Respiratory: Negative for cough and shortness of breath.   Cardiovascular: Negative for cp or palpitations    Gastrointestinal: Negative for nausea, diarrhea and constipation.  Genitourinary: Negative for urgency and frequency.  Skin: Negative for pallor or rash   Neurological: Negative for light-headedness, numbness and headaches. pos for baseline gait disorder/ balance problem and speech slurring  Hematological: Negative for adenopathy. Does not bruise/bleed easily.  Psychiatric/Behavioral: Negative for dysphoric mood. The patient is not nervous/anxious.         Objective:   Physical Exam  Constitutional: She appears well-developed and well-nourished. No distress.       Well appearing  Weight loss noted   HENT:  Head: Normocephalic and atraumatic.  Right Ear: External ear normal.  Left Ear: External  ear normal.  Mouth/Throat: Oropharynx is clear and moist.       Mild nasal cong- pt is getting over a head cold  Eyes: Conjunctivae normal and EOM are normal. Pupils are equal, round, and reactive to light. Right eye exhibits no discharge. Left eye exhibits no discharge. No scleral icterus.  Neck: Normal range of motion. Neck supple. No JVD present. Carotid bruit is not present. No thyromegaly present.  Cardiovascular: Normal rate, regular rhythm and intact distal pulses.  Exam reveals no gallop.   Murmur heard. Pulmonary/Chest: Effort normal and breath sounds normal. No respiratory distress. She has no wheezes.  Abdominal: Soft. Bowel sounds are normal. She exhibits no distension, no abdominal bruit and no mass. There is no tenderness.  Musculoskeletal: She exhibits no edema.  Lymphadenopathy:    She has no cervical adenopathy.  Neurological: She is alert. She has normal reflexes. No cranial nerve deficit. She exhibits normal muscle tone. Coordination normal.  Skin: Skin is warm and dry. No rash noted. No erythema. No pallor.  Psychiatric:       Cheerful and talkative today          Assessment & Plan:

## 2012-09-25 NOTE — Assessment & Plan Note (Signed)
Affects speech and gait  Sees neurologist regularly Uses walker (wheels/ brakes and seat) , as well as scooter occas

## 2012-09-25 NOTE — Assessment & Plan Note (Signed)
Lipids today on welchol Cannot have statins at all Diet is better and commended on wt loss Rev low sat fat diet  Labs today

## 2012-09-25 NOTE — Patient Instructions (Addendum)
No change in medications  Labs today  Blood pressure is stable When you are feeling better - you need a flu vaccine and a pneumonia vaccine Also If you are interested in a shingles/zoster vaccine - call your insurance to check on coverage,( you should not get it within 1 month of other vaccines) , then call us for a prescription  for it to take to a pharmacy that gives the shot , or make a nurse visit to get it here depending on your coverage    get your mammogram as planned

## 2012-09-28 ENCOUNTER — Encounter: Payer: Self-pay | Admitting: *Deleted

## 2012-10-08 ENCOUNTER — Encounter: Payer: Self-pay | Admitting: Family Medicine

## 2012-10-09 ENCOUNTER — Encounter: Payer: Self-pay | Admitting: *Deleted

## 2012-11-11 ENCOUNTER — Ambulatory Visit: Payer: Medicare PPO

## 2012-11-18 ENCOUNTER — Ambulatory Visit (INDEPENDENT_AMBULATORY_CARE_PROVIDER_SITE_OTHER): Payer: Medicare PPO | Admitting: *Deleted

## 2012-11-18 DIAGNOSIS — Z23 Encounter for immunization: Secondary | ICD-10-CM

## 2012-12-09 ENCOUNTER — Ambulatory Visit (INDEPENDENT_AMBULATORY_CARE_PROVIDER_SITE_OTHER): Payer: Medicare PPO | Admitting: *Deleted

## 2012-12-09 DIAGNOSIS — Z23 Encounter for immunization: Secondary | ICD-10-CM

## 2013-01-26 DIAGNOSIS — R269 Unspecified abnormalities of gait and mobility: Secondary | ICD-10-CM | POA: Insufficient documentation

## 2013-01-27 ENCOUNTER — Encounter: Payer: Self-pay | Admitting: Family Medicine

## 2013-01-27 ENCOUNTER — Ambulatory Visit (INDEPENDENT_AMBULATORY_CARE_PROVIDER_SITE_OTHER): Payer: Medicare PPO | Admitting: Family Medicine

## 2013-01-27 VITALS — BP 126/82 | HR 75 | Temp 98.8°F | Ht 64.0 in | Wt 171.8 lb

## 2013-01-27 DIAGNOSIS — J069 Acute upper respiratory infection, unspecified: Secondary | ICD-10-CM

## 2013-01-27 NOTE — Progress Notes (Signed)
Subjective:    Patient ID: Jaime Mccarthy, female    DOB: 1940-12-24, 72 y.o.   MRN: [id number]  HPI Here with uri for over a week  Got worried because she was not doing better Lot of coughing -making her chest sore -more than usual Today is moving into head- with congestion  Can hear rattling cong in her chest- but cannot get much up   This am - sounds a little better   No fever   Lot of nasal congestion- clear  Face is not hurting    Patient Active Problem List  Diagnosis  . FIBROIDS, UTERUS  . UNSPECIFIED VITAMIN D DEFICIENCY  . HYPERCHOLESTEROLEMIA  . DISORDER, DEPRESSIVE NEC  . ATAXIA, CEREBELLAR NEC  . HYPERTENSION  . ALLERGIC RHINITIS  . FIBROCYSTIC BREAST DISEASE  . HEART MURMUR, HX OF   Past Medical History  Diagnosis Date  . Allergy     allergic rhinitis seasonal  . Hypertension   . Hyperlipidemia   . Ataxia     degenerative cerebellar   Past Surgical History  Procedure Laterality Date  . Breast surgery      left breast needle biopsy negative   History  Substance Use Topics  . Smoking status: Former Games developer  . Smokeless tobacco: Not on file  . Alcohol Use: No   Family History  Problem Relation Age of Onset  . COPD Mother   . Cancer Mother     breast Ca  . Heart disease Father     CAD  . Cancer Father     lung CA  . Depression Daughter   . Heart disease Maternal Grandmother     heart failure   No Known Allergies Current Outpatient Prescriptions on File Prior to Visit  Medication Sig Dispense Refill  . aspirin 81 MG tablet Take 81 mg by mouth daily as needed.        . cholecalciferol (VITAMIN D) 1000 UNITS tablet Take 1,000 Units by mouth 2 (two) times daily.        . colesevelam (WELCHOL) 625 MG tablet Take 2 tablets (1,250 mg total) by mouth 3 (three) times daily after meals.  54 tablet  3  . Flaxseed, Linseed, (FLAX SEED OIL) 1000 MG CAPS Take 2 capsules by mouth daily.        . hydrochlorothiazide (HYDRODIURIL) 25 MG tablet Take 1 tablet  (25 mg total) by mouth daily.  90 tablet  3  . metoprolol (TOPROL-XL) 50 MG 24 hr tablet Take 0.5 tablets (25 mg total) by mouth daily.  45 tablet  3   No current facility-administered medications on file prior to visit.      Review of Systems Review of Systems  Constitutional: Negative for fever, appetite change,  and unexpected weight change. ENT pos for congestion and rhinorrhea and St , neg for sinus pain   Eyes: Negative for pain and visual disturbance.  Respiratory: Negative for wheeze  and shortness of breath.   Cardiovascular: Negative for cp or palpitations    Gastrointestinal: Negative for nausea, diarrhea and constipation.  Genitourinary: Negative for urgency and frequency.  Skin: Negative for pallor or rash   Neurological: Negative for weakness, light-headedness, numbness and headaches.  Hematological: Negative for adenopathy. Does not bruise/bleed easily.  Psychiatric/Behavioral: Negative for dysphoric mood. The patient is not nervous/anxious.         Objective:   Physical Exam  Constitutional: She appears well-developed and well-nourished. No distress.  HENT:  Head: Normocephalic and atraumatic.  Right Ear: External ear normal.  Left Ear: External ear normal.  Mouth/Throat: Oropharynx is clear and moist. No oropharyngeal exudate.  Nares are injected and congested   No sinus tenderness Clear rhinorrhea  Eyes: Conjunctivae and EOM are normal. Pupils are equal, round, and reactive to light. Right eye exhibits no discharge. Left eye exhibits no discharge.  Neck: Normal range of motion. Neck supple.  Cardiovascular: Normal rate, regular rhythm and normal heart sounds.   Pulmonary/Chest: Effort normal and breath sounds normal. No respiratory distress. She has no wheezes. She has no rales.  No rhonchi or crackles  Lymphadenopathy:    She has no cervical adenopathy.  Neurological: She is alert.  Skin: Skin is warm and dry. No rash noted.  Psychiatric: She has a normal  mood and affect.          Assessment & Plan:

## 2013-01-27 NOTE — Assessment & Plan Note (Signed)
Reassuring exam  Recommend mucinex DM- will update  Disc symptomatic care - see instructions on AVS  Update if not starting to improve in a week or if worsening

## 2013-01-27 NOTE — Patient Instructions (Addendum)
I think you have a head and chest cold  Your lung exam is reassuring- but if symptoms worsen- let me know  chlorcedin is ok to use Try mucinex DM for cough as directed  If you need something stronger for cough let me know Drink lots of fluids and get rest

## 2013-03-02 ENCOUNTER — Other Ambulatory Visit: Payer: Self-pay | Admitting: Neurology

## 2013-03-10 NOTE — Progress Notes (Signed)
Quick Note:  I called and left a message for the patient that her labs were normal. ______

## 2013-05-18 ENCOUNTER — Encounter: Payer: Self-pay | Admitting: Family Medicine

## 2013-05-18 ENCOUNTER — Ambulatory Visit (INDEPENDENT_AMBULATORY_CARE_PROVIDER_SITE_OTHER): Payer: Medicare PPO | Admitting: Family Medicine

## 2013-05-18 ENCOUNTER — Ambulatory Visit (INDEPENDENT_AMBULATORY_CARE_PROVIDER_SITE_OTHER)
Admission: RE | Admit: 2013-05-18 | Discharge: 2013-05-18 | Disposition: A | Discharge status: Home / Self Care | Payer: Medicare PPO | Source: Ambulatory Visit | Attending: Family Medicine | Admitting: Family Medicine

## 2013-05-18 VITALS — BP 136/82 | HR 68 | Temp 98.4°F | Ht 64.0 in | Wt 170.2 lb

## 2013-05-18 DIAGNOSIS — M25569 Pain in unspecified knee: Secondary | ICD-10-CM

## 2013-05-18 DIAGNOSIS — M25562 Pain in left knee: Secondary | ICD-10-CM

## 2013-05-18 NOTE — Patient Instructions (Addendum)
Use ice on your knee when it hurts for 10 minutes at a time and elevate it Xray today -will call you with results  Use bike if it does not hurt Will make a plan when xray results come back  Aleve is ok with food occasionally if you need it

## 2013-05-18 NOTE — Assessment & Plan Note (Addendum)
With mild soft tissue swelling/ and tenderness over medial joint line and also patellar tendon  No effusion Diff incl meniscal inj and OA (pt has had multiple falls) Xray today  Disc symptomatic care - see instructions on AVS

## 2013-05-18 NOTE — Progress Notes (Signed)
Subjective:    Patient ID: Jaime Mccarthy, female    DOB: May 11, 1941, 72 y.o.   MRN: [id number]  HPI Here for knee pain left  5/12 pain - problems bearing weight after 4 hour car trip Then got better  Fall in kitchen 5/17 hit her hip (not knee) Has fallen on knees in past however  Now on and off for 5 weeks  Some swelling  Ache/ dull/ aspirin seems to help and occ aleve  Hurts worst to straighten knee when standing (better if lying down)  Has baseline neurologic problem with impaired gait and uses walker Has exercise bike that she can use for short periods of time    Patient Active Problem List   Diagnosis Date Noted  . UNSPECIFIED VITAMIN D DEFICIENCY 01/04/2009  . ATAXIA, CEREBELLAR NEC 05/07/2007  . DISORDER, 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  . FIBROIDS, UTERUS 03/16/2007  . HYPERCHOLESTEROLEMIA 03/16/2007  . HYPERTENSION 03/16/2007  . ALLERGIC RHINITIS 03/16/2007  . FIBROCYSTIC BREAST DISEASE 03/16/2007  . HEART MURMUR, HX OF 03/16/2007   Past Medical History  Diagnosis Date  . Allergy     allergic rhinitis seasonal  . Hypertension   . Hyperlipidemia   . Ataxia     degenerative cerebellar   Past Surgical History  Procedure Laterality Date  . Breast surgery      left breast needle biopsy negative   History  Substance Use Topics  . Smoking status: Former Games developer  . Smokeless tobacco: Not on file  . Alcohol Use: No   Family History  Problem Relation Age of Onset  . COPD Mother   . Cancer Mother     breast Ca  . Heart disease Father     CAD  . Cancer Father     lung CA  . Depression Daughter   . Heart disease Maternal Grandmother     heart failure   No Known Allergies Current Outpatient Prescriptions on File Prior to Visit  Medication Sig Dispense Refill  . aspirin 81 MG tablet Take 81 mg by mouth daily as needed.        . cholecalciferol (VITAMIN D) 1000 UNITS tablet Take 1,000 Units by mouth 2 (two) times daily.        . colesevelam (WELCHOL) 625 MG  tablet Take 2 tablets (1,250 mg total) by mouth 3 (three) times daily after meals.  54 tablet  3  . Flaxseed, Linseed, (FLAX SEED OIL) 1000 MG CAPS Take 2 capsules by mouth daily.        . hydrochlorothiazide (HYDRODIURIL) 25 MG tablet Take 1 tablet (25 mg total) by mouth daily.  90 tablet  3  . metoprolol (TOPROL-XL) 50 MG 24 hr tablet Take 0.5 tablets (25 mg total) by mouth daily.  45 tablet  3   No current facility-administered medications on file prior to visit.     Review of Systems    Review of Systems  Constitutional: Negative for fever, appetite change, fatigue and unexpected weight change.  Eyes: Negative for pain and visual disturbance.  Respiratory: Negative for cough and shortness of breath.   Cardiovascular: Negative for cp or palpitations    Gastrointestinal: Negative for nausea, diarrhea and constipation.  Genitourinary: Negative for urgency and frequency.  Skin: Negative for pallor or rash   MSK pos for knee pain and swelling, neg for other joint swelling or change Neurological: Negative for , light-headedness, numbness and headaches. pos for weakness and poor balance baseline  Hematological: Negative for adenopathy. Does not  bruise/bleed easily.  Psychiatric/Behavioral: Negative for dysphoric mood. The patient is not nervous/anxious.      Objective:   Physical Exam  Constitutional: She appears well-developed and well-nourished. No distress.  obese and well appearing   HENT:  Head: Normocephalic and atraumatic.  Cardiovascular: Normal rate and regular rhythm.   Musculoskeletal: She exhibits edema and tenderness.       Left knee: She exhibits decreased range of motion and swelling. She exhibits no effusion, no ecchymosis, no deformity, no erythema, normal alignment, no LCL laxity, normal patellar mobility and no bony tenderness. Tenderness found. Medial joint line and patellar tendon tenderness noted.  L knee - pain with 120 deg of flex and with full extension Tender  over patellar tendon (neg patellar grind) Tender over medial joint line No crepitus    Neurological: She is alert. She has normal strength. Coordination and gait abnormal.  Baseline ataxia - aided by a walker  Skin: Skin is warm and dry. No rash noted. No erythema. No pallor.  Psychiatric: She has a normal mood and affect.          Assessment & Plan:

## 2013-05-26 ENCOUNTER — Ambulatory Visit (INDEPENDENT_AMBULATORY_CARE_PROVIDER_SITE_OTHER): Payer: Medicare PPO | Admitting: Family Medicine

## 2013-05-26 ENCOUNTER — Encounter: Payer: Self-pay | Admitting: Family Medicine

## 2013-05-26 VITALS — BP 120/74 | HR 72 | Temp 98.2°F | Ht 64.0 in | Wt 169.5 lb

## 2013-05-26 DIAGNOSIS — M25562 Pain in left knee: Secondary | ICD-10-CM

## 2013-05-26 DIAGNOSIS — M2390 Unspecified internal derangement of unspecified knee: Secondary | ICD-10-CM

## 2013-05-26 DIAGNOSIS — M2392 Unspecified internal derangement of left knee: Secondary | ICD-10-CM

## 2013-05-26 DIAGNOSIS — M25569 Pain in unspecified knee: Secondary | ICD-10-CM

## 2013-05-26 NOTE — Progress Notes (Signed)
[facility] conservation officer at [facility] [address] [place] [address] Phone: [phone] Fax: [phone]  Date:  05/26/2013   Name:  Jaime Mccarthy   DOB:  [birth year]   MRN:  [id number] Gender: female Age: 72 y.o.  Primary Physician:  Roxy Manns, MD  Evaluating MD: Hannah Beat, MD   Chief Complaint: Knee Pain   History of Present Illness:  Jaime Mccarthy is a 72 y.o. pleasant patient who presents with the following:  No idea, but did fall in the kitchen in 5 weeks ago. Cerebellar ataxia - pure.  Never had a knee surgery.   Baseline significant cerebellar ataxia and walks with a walker.   Patient presents with 5 week h/o Lsided knee pain after no clear accident or trauma, but she did fall before then in the kitchen and struck her hip. No audible pop was heard. The patient has had an effusion. Occ symptomatic giving-way. No mechanical clicking. Joint has not locked up. Patient has been able to walk but is limping. The patient does have pain going up and down stairs or rising from a seated position.   Pain location: more medial Current physical activity: minimal Prior Knee Surgery: none Current pain meds: tylenol, advil Bracing: none  Dg Knee Complete 4 Views Left  05/18/2013   *RADIOLOGY REPORT*  Clinical Data: Left knee pain and swelling  LEFT KNEE - COMPLETE 4+ VIEW  Comparison: None  Findings: Osseous mineralization normal. Mild lateral compartment joint space narrowing. Remaining joint spaces grossly preserved. No acute fracture, dislocation or bone destruction. Small knee joint effusion present. Soft tissues otherwise unremarkable.  IMPRESSION: Minimal degenerative changes and small knee joint effusion.   Original Report Authenticated By: Ulyses Southward, M.D.    -- joint spaces relatively preserved with minimal OA changes. Hannah Beat, MD  Patient Active Problem List   Diagnosis Date Noted  . Left knee pain 05/18/2013  . UNSPECIFIED VITAMIN D DEFICIENCY  01/04/2009  . ATAXIA, CEREBELLAR NEC 05/07/2007  . DISORDER, 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  . FIBROIDS, UTERUS 03/16/2007  . HYPERCHOLESTEROLEMIA 03/16/2007  . HYPERTENSION 03/16/2007  . ALLERGIC RHINITIS 03/16/2007  . FIBROCYSTIC BREAST DISEASE 03/16/2007  . HEART MURMUR, HX OF 03/16/2007    Past Medical History  Diagnosis Date  . Allergy     allergic rhinitis seasonal  . Hypertension   . Hyperlipidemia   . Ataxia     degenerative cerebellar    Past Surgical History  Procedure Laterality Date  . Breast surgery      left breast needle biopsy negative    History   Social History  . Marital Status: Married    Spouse Name: N/A    Number of Children: N/A  . Years of Education: N/A   Occupational History  . Not on file.   Social History Main Topics  . Smoking status: Former Games developer  . Smokeless tobacco: Not on file  . Alcohol Use: No  . Drug Use: No  . Sexually Active: Not on file   Other Topics Concern  . Not on file   Social History Narrative  . No narrative on file    Family History  Problem Relation Age of Onset  . COPD Mother   . Cancer Mother     breast Ca  . Heart disease Father     CAD  . Cancer Father     lung CA  . Depression Daughter   . Heart disease Maternal Grandmother     heart  failure    No Known Allergies  Medication list has been reviewed and updated.  Outpatient Prescriptions Prior to Visit  Medication Sig Dispense Refill  . aspirin 81 MG tablet Take 81 mg by mouth daily as needed.        . cholecalciferol (VITAMIN D) 1000 UNITS tablet Take 1,000 Units by mouth 2 (two) times daily.        . colesevelam (WELCHOL) 625 MG tablet Take 2 tablets (1,250 mg total) by mouth 3 (three) times daily after meals.  54 tablet  3  . Flaxseed, Linseed, (FLAX SEED OIL) 1000 MG CAPS Take 2 capsules by mouth daily.        . hydrochlorothiazide (HYDRODIURIL) 25 MG tablet Take 1 tablet (25 mg total) by mouth daily.  90 tablet  3  . metoprolol  (TOPROL-XL) 50 MG 24 hr tablet Take 0.5 tablets (25 mg total) by mouth daily.  45 tablet  3   No facility-administered medications prior to visit.    Review of Systems:   GEN: No fevers, chills. Nontoxic. Primarily MSK c/o today. MSK: Detailed in the HPI GI: tolerating PO intake without difficulty Neuro: No numbness, parasthesias, or tingling associated. Otherwise the pertinent positives of the ROS are noted above.    Physical Examination: BP 120/74  Pulse 72  Temp(Src) 98.2 F (36.8 C) (Oral)  Ht 5\' 4"  (1.626 m)  Wt 169 lb 8 oz (76.885 kg)  BMI 29.08 kg/m2  SpO2 98%  Ideal Body Weight: Weight in (lb) to have BMI = 25: 145.3   GEN: WDWN, NAD, Non-toxic, Alert & Oriented x 3 HEENT: Atraumatic, Normocephalic.  Ears and Nose: No external deformity. EXTR: No clubbing/cyanosis/edema NEURO: ataxia PSYCH: Normally interactive. Conversant. Not depressed or anxious appearing.  Calm demeanor.   Knee:  LEFT Gait: ataxia ROM: 0-120 Effusion: moderate Echymosis or edema: none Patellar tendon NT Painful PLICA: neg Patellar grind: POS Medial and lateral patellar facet loading: negative medial and lateral joint lines: MEDIAL TTP MOSTLY POSTERIOR Mcmurray's PAINFUL Flexion-pinch POS Varus and valgus stress: stable Lachman: neg Ant and Post drawer: neg Hip abduction, IR, ER: WNL Hip flexion str: 5/5 Hip abd: 5/5 Quad: 5/5 VMO atrophy: MILD Hamstring concentric and eccentric: 5/5   Assessment and Plan:  Left knee pain  Internal derangement of knee, left  By history and exam, more likely degenerative meniscal tear vs pure oa flare. Effusion limits exam.  Cont advil, ice F/u in 8 weeks if not improving.  Knee Aspiration and Injection, LEFT Patient verbally consented; risks, benefits, and alternatives explained including possible infection. Patient prepped with Chloraprep. Ethyl chloride for anesthesia. 10 cc of 1% Lidocaine used in wheal then injected Subcutaneous  fashion with 22 gauge needle on lateral approach. Under sterilne conditions, 18 gauge needle used via lateral approach to aspirate 40 cc of clear, yellow synovial fluid. Then 8 cc of Lidocaine 1% and 2 cc of Depo-Medrol 40 mg injected. Tolerated well, decreased pain, no complications.   Orders Today:  No orders of the defined types were placed in this encounter.    Updated Medication List: (Includes new medications, updates to list, dose adjustments) No orders of the defined types were placed in this encounter.    Medications Discontinued: There are no discontinued medications.    Signed, Elpidio Galea. Giannina Bartolome, MD 05/26/2013 11:53 AM

## 2013-06-08 ENCOUNTER — Encounter: Payer: Self-pay | Admitting: Family Medicine

## 2013-07-07 ENCOUNTER — Encounter: Payer: Self-pay | Admitting: Neurology

## 2013-07-07 DIAGNOSIS — R269 Unspecified abnormalities of gait and mobility: Secondary | ICD-10-CM

## 2013-07-08 ENCOUNTER — Encounter: Payer: Self-pay | Admitting: Neurology

## 2013-07-08 ENCOUNTER — Ambulatory Visit (INDEPENDENT_AMBULATORY_CARE_PROVIDER_SITE_OTHER): Payer: Medicare PPO | Admitting: Neurology

## 2013-07-08 VITALS — BP 141/75 | HR 69 | Ht 69.0 in | Wt 168.0 lb

## 2013-07-08 DIAGNOSIS — R269 Unspecified abnormalities of gait and mobility: Secondary | ICD-10-CM

## 2013-07-08 DIAGNOSIS — G119 Hereditary ataxia, unspecified: Secondary | ICD-10-CM

## 2013-07-08 DIAGNOSIS — F482 Pseudobulbar affect: Secondary | ICD-10-CM | POA: Insufficient documentation

## 2013-07-08 HISTORY — DX: Pseudobulbar affect: F48.2

## 2013-07-08 NOTE — Progress Notes (Signed)
Reason for visit: Spinocerebellar ataxia  Jaime Mccarthy is an 72 y.o. 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 The patient has reported some issues with swallowing, but she never went for a swallowing evaluation, claiming that the problems were not significant. The patient has had occasional falls, usually when she is not using her walker. The patient has noted that her right foot will turn and at times. The patient has had some problems with degenerative arthritis of the left knee, and she is recently had an injection of the knee. The patient returns for an evaluation. The patient has undergone physical therapy approximately a year ago for her walking, but she indicates that she never kept up with the exercises that they gave her to take home.  Past Medical History  Diagnosis Date  . Allergy     allergic rhinitis seasonal  . Hypertension   . Hyperlipidemia   . Ataxia     degenerative cerebellar  . Gait disorder   . Dyslipidemia   . 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 Degenerative arthritis      Left knee    Past Surgical History  Procedure Laterality Date  . Breast surgery      left breast needle biopsy negative  . Gallbladder surgery      Family History  Problem Relation Age of Onset  . COPD Mother   . Cancer Mother     breast Ca  . Heart disease Father     CAD  . Cancer Father     lung CA  . Depression Daughter   . Heart disease Maternal Grandmother     heart failure    Social history:  reports that she has quit smoking. She does not have any smokeless tobacco history on file. She reports that  drinks alcohol. She reports that she does not use illicit drugs.  Allergies: No Known  Allergies  Medications:  Current Outpatient Prescriptions on File Prior to Visit  Medication Sig Dispense Refill  . aspirin 81 MG tablet Take 81 mg by mouth daily as needed.        . cholecalciferol (VITAMIN D) 1000 UNITS tablet Take 1,000 Units by mouth 2 (two) times daily.        . Flaxseed, Linseed, (FLAX SEED OIL) 1000 MG CAPS Take 2 capsules by mouth daily.        . hydrochlorothiazide (HYDRODIURIL) 25 MG tablet Take 1 tablet (25 mg total) by mouth daily.  90 tablet  3  . metoprolol (TOPROL-XL) 50 MG 24 hr tablet Take 0.5 tablets (25 mg total) by mouth daily.  45 tablet  3   No current facility-administered medications on file prior to visit.    ROS:  Out of a complete 14 system review of symptoms, the patient complains only of the following symptoms, and all other reviewed systems are negative.  Gait disorder Swallowing problems  Blood pressure 141/75, pulse 69, height 5\' 9"  (1.753 m), weight 168 lb (76.204 kg).  Physical Exam  General: The patient is alert and cooperative at the time of the examination.  Skin: No significant peripheral edema is noted.   Neurologic Exam  Cranial nerves: Facial symmetry is present. Speech is ataxic, slightly dysarthric Extraocular  movements are full. Visual fields are full. End gaze nystagmus is seen bilaterally.  Motor: The patient has good strength in all 4 extremities.  Coordination: The patient has good finger-nose-finger and heel-to-shin bilaterally, with minimal ataxia noted bilaterally.  Gait and station: The patient has a wide-based, ataxic gait. The patient walks with a walker. There is some intorsion of the right foot with walking. Tandem gait was not attempted. Romberg is negative, but is unsteady. No drift is seen.  Reflexes: Deep tendon reflexes are symmetric, depressed in the lower extremities.   Assessment/Plan:  1. Spinocerebellar ataxia  2. Pseudobulbar affect  3. Gait disorder  The patient will be sent for  physical therapy once again for gait training. The patient will followup in 9 or 10 months. The patient will contact our office if needed.  Jaime Palau MD 07/08/2013 7:24 PM  [facility] [address] [place], [place] [address]  Phone [phone] Fax [phone]

## 2013-07-15 ENCOUNTER — Encounter: Payer: Self-pay | Admitting: Family Medicine

## 2013-08-23 ENCOUNTER — Telehealth: Payer: Self-pay | Admitting: Family Medicine

## 2013-08-23 MED ORDER — HYDROCHLOROTHIAZIDE 25 MG PO TABS: 25.0000 mg | ORAL_TABLET | Freq: Every day | ORAL | Status: DC

## 2013-08-23 MED ORDER — METOPROLOL SUCCINATE ER 50 MG PO TB24: ORAL_TABLET | ORAL | Status: DC

## 2013-08-23 NOTE — Telephone Encounter (Signed)
Needs refills on meds Will print to fax In IN box

## 2013-08-24 NOTE — Telephone Encounter (Signed)
Rx faxed to [facility], and left voicemail letting pt know Rxs faxed

## 2013-09-02 ENCOUNTER — Telehealth: Payer: Self-pay

## 2013-09-02 NOTE — Telephone Encounter (Signed)
I called patient and left VM that I will be mailing out forms today and will send a copy to their home address as well.

## 2013-10-04 ENCOUNTER — Telehealth: Payer: Self-pay | Admitting: Family Medicine

## 2013-10-04 NOTE — Telephone Encounter (Signed)
Seana Underwood dropped off a handicap form to be filled out for [employer]. He stated to call him when it was ready. [phone].

## 2013-10-04 NOTE — Telephone Encounter (Signed)
Left voicemail letting pt know form ready for pick up 

## 2013-10-04 NOTE — Telephone Encounter (Signed)
Done and in IN box 

## 2013-10-04 NOTE — Telephone Encounter (Signed)
Form in your inbox 

## 2013-10-07 ENCOUNTER — Other Ambulatory Visit: Payer: Self-pay

## 2013-10-19 ENCOUNTER — Encounter: Payer: Self-pay | Admitting: Family Medicine

## 2013-11-15 ENCOUNTER — Telehealth: Payer: Self-pay | Admitting: Family Medicine

## 2013-11-15 DIAGNOSIS — I10 Essential (primary) hypertension: Secondary | ICD-10-CM

## 2013-11-15 DIAGNOSIS — E559 Vitamin D deficiency, unspecified: Secondary | ICD-10-CM

## 2013-11-15 DIAGNOSIS — E7800 Pure hypercholesterolemia, unspecified: Secondary | ICD-10-CM

## 2013-11-15 NOTE — Telephone Encounter (Signed)
Message copied by Judy Pimple on Mon Nov 15, 2013  4:21 PM ------      Message from: Alvina Chou      Created: Wed Nov 10, 2013  5:59 PM      Regarding: Lab orders for Thursday, 12.18.14       Patient is scheduled for CPX labs, please order future labs, Thanks , Terri       ------

## 2013-11-18 ENCOUNTER — Other Ambulatory Visit (INDEPENDENT_AMBULATORY_CARE_PROVIDER_SITE_OTHER): Payer: Medicare PPO

## 2013-11-18 DIAGNOSIS — E7800 Pure hypercholesterolemia, unspecified: Secondary | ICD-10-CM

## 2013-11-18 DIAGNOSIS — I10 Essential (primary) hypertension: Secondary | ICD-10-CM

## 2013-11-18 DIAGNOSIS — E559 Vitamin D deficiency, unspecified: Secondary | ICD-10-CM

## 2013-11-18 LAB — CBC WITH DIFFERENTIAL/PLATELET
Basophils Relative: 0.3 % (ref 0.0–3.0)
Eosinophils Absolute: 0.1 10*3/uL (ref 0.0–0.7)
Eosinophils Relative: 1.2 % (ref 0.0–5.0)
Hemoglobin: 14.8 g/dL (ref 12.0–15.0)
Lymphocytes Relative: 34.9 % (ref 12.0–46.0)
MCHC: 33.5 g/dL (ref 30.0–36.0)
MCV: 87.4 fl (ref 78.0–100.0)
Monocytes Absolute: 0.7 10*3/uL (ref 0.1–1.0)
Neutro Abs: 4.4 10*3/uL (ref 1.4–7.7)
RBC: 5.05 Mil/uL (ref 3.87–5.11)
WBC: 8 10*3/uL (ref 4.5–10.5)

## 2013-11-18 LAB — COMPREHENSIVE METABOLIC PANEL
ALT: 22 U/L (ref 0–35)
AST: 24 U/L (ref 0–37)
Albumin: 4.4 g/dL (ref 3.5–5.2)
Alkaline Phosphatase: 82 U/L (ref 39–117)
BUN: 12 mg/dL (ref 6–23)
CO2: 31 mEq/L (ref 19–32)
Calcium: 9.3 mg/dL (ref 8.4–10.5)
Chloride: 100 mEq/L (ref 96–112)
Creatinine, Ser: 0.7 mg/dL (ref 0.4–1.2)
GFR: 86.49 mL/min (ref >60.00)
Glucose, Bld: 91 mg/dL (ref 70–99)
Potassium: 3.7 mEq/L (ref 3.5–5.1)
Sodium: 137 mEq/L (ref 135–145)
Total Protein: 7.7 g/dL (ref 6.0–8.3)

## 2013-11-18 LAB — LIPID PANEL
Cholesterol: 250 mg/dL — ABNORMAL HIGH (ref 0–200)
HDL: 56.6 mg/dL (ref >39.00)
Total CHOL/HDL Ratio: 4
Triglycerides: 123 mg/dL (ref 0.0–149.0)

## 2013-11-18 LAB — TSH: TSH: 3.47 u[IU]/mL (ref 0.35–5.50)

## 2013-11-19 LAB — VITAMIN D 25 HYDROXY (VIT D DEFICIENCY, FRACTURES): Vit D, 25-Hydroxy: 57 ng/mL (ref 30–89)

## 2013-11-26 ENCOUNTER — Ambulatory Visit (INDEPENDENT_AMBULATORY_CARE_PROVIDER_SITE_OTHER): Payer: Medicare PPO | Admitting: Family Medicine

## 2013-11-26 ENCOUNTER — Encounter: Payer: Self-pay | Admitting: Family Medicine

## 2013-11-26 VITALS — BP 132/80 | HR 62 | Temp 98.2°F | Ht 64.0 in | Wt 176.2 lb

## 2013-11-26 DIAGNOSIS — I10 Essential (primary) hypertension: Secondary | ICD-10-CM

## 2013-11-26 DIAGNOSIS — Z23 Encounter for immunization: Secondary | ICD-10-CM

## 2013-11-26 DIAGNOSIS — Z1211 Encounter for screening for malignant neoplasm of colon: Secondary | ICD-10-CM

## 2013-11-26 DIAGNOSIS — Z0000 Encounter for general adult medical examination without abnormal findings: Secondary | ICD-10-CM | POA: Insufficient documentation

## 2013-11-26 DIAGNOSIS — E559 Vitamin D deficiency, unspecified: Secondary | ICD-10-CM

## 2013-11-26 NOTE — Progress Notes (Signed)
Subjective:    Patient ID: Jaime Mccarthy, female    DOB: 1941-07-04, 72 y.o.   MRN: [id number]  HPI I have personally reviewed the Medicare Annual Wellness questionnaire and have noted 1. The patient's medical and social history 2. Their use of alcohol, tobacco or illicit drugs 3. Their current medications and supplements 4. The patient's functional ability including ADL's, fall risks, home safety risks and hearing or visual             impairment. 5. Diet and physical activities 6. Evidence for depression or mood disorders  The patients weight, height, BMI have been recorded in the chart and visual acuity is per eye clinic.  I have made referrals, counseling and provided education to the patient based review of the above and I have provided the pt with a written personalized care plan for preventive services.  See scanned forms.  Routine anticipatory guidance given to patient.  See health maintenance. Flu- wants to get that today Shingles- will call her ins about coverage/ her neurologist says it is ok  PNA12/13 up to date  Tetanus 4/06  Colonosc 12/04 - due for screening - she will do a stool card  Breast cancer screening mammogram 11/14 normal  Self breast exam no lumps  Advance directive - she has a living will  Cognitive function addressed- see scanned forms- and if abnormal then additional documentation follows. 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   ROS: See HPI.  Otherwise negative.    Nl dexa 03     Chemistry      Component Value Date/Time   NA 137 11/18/2013 0858   K 3.7 11/18/2013 0858   CL 100 11/18/2013 0858   CO2 31 11/18/2013 0858   BUN 12 11/18/2013 0858   CREATININE 0.7 11/18/2013 0858      Component Value Date/Time   CALCIUM 9.3 11/18/2013 0858   ALKPHOS 82 11/18/2013 0858   AST 24 11/18/2013 0858   ALT 22 11/18/2013 0858     BILITOT 0.6 11/18/2013 0858      Lab Results  Component Value Date   WBC 8.0 11/18/2013   HGB 14.8 11/18/2013   HCT 44.2 11/18/2013   MCV 87.4 11/18/2013   PLT 292.0 11/18/2013    Glucose 91  Lab Results  Component Value Date   TSH 3.47 11/18/2013     Hyperlipidemia - she has been off welchol due to cost for 6 months  It will become generic next march  She will let me know when she can afford it  Diet is fair- too many sweets   Lab Results  Component Value Date   CHOL 250* 11/18/2013   CHOL 222* 09/25/2012   CHOL 207* 03/03/2012   Lab Results  Component Value Date   HDL 56.60 11/18/2013   HDL 59.90 09/25/2012   HDL 60.50 03/03/2012   Lab Results  Component Value Date   LDLCALC 153* 10/04/2008   Lab Results  Component Value Date   TRIG 123.0 11/18/2013   TRIG 94.0 09/25/2012   TRIG 146.0 03/03/2012   Lab Results  Component Value Date   CHOLHDL 4 11/18/2013   CHOLHDL 4 09/25/2012   CHOLHDL 3 03/03/2012   Lab Results  Component Value Date   LDLDIRECT 186.5 11/18/2013   LDLDIRECT 147.0 09/25/2012   LDLDIRECT 133.8 03/03/2012  chol is up off welchol  She could do better with diet    Review of Systems Review of Systems  Constitutional: Negative for fever, appetite change, fatigue and unexpected weight change.  Eyes: Negative for pain and visual disturbance.  Respiratory: Negative for cough and shortness of breath.   Cardiovascular: Negative for cp or palpitations    Gastrointestinal: Negative for nausea, diarrhea and constipation.  Genitourinary: Negative for urgency and frequency.  Skin: Negative for pallor or rash   Neurological: Negative for light-headedness, numbness and headaches. pos for baseline ataxia and speech problems  Hematological: Negative for adenopathy. Does not bruise/bleed easily.  Psychiatric/Behavioral: Negative for dysphoric mood. The patient is not nervous/anxious.         Objective:   Physical Exam  Constitutional: She appears  well-developed and well-nourished. No distress.  obese and well appearing   HENT:  Head: Normocephalic and atraumatic.  Right Ear: External ear normal.  Left Ear: External ear normal.  Mouth/Throat: Oropharynx is clear and moist.  Eyes: Conjunctivae and EOM are normal. Pupils are equal, round, and reactive to light. No scleral icterus.  Neck: Normal range of motion. Neck supple. No JVD present. Carotid bruit is not present. No thyromegaly present.  Cardiovascular: Normal rate, regular rhythm, normal heart sounds and intact distal pulses.  Exam reveals no gallop.   Pulmonary/Chest: Effort normal and breath sounds normal. No respiratory distress. She has no wheezes. She exhibits no tenderness.  Abdominal: Soft. Bowel sounds are normal. She exhibits no distension, no abdominal bruit and no mass. There is no tenderness.  Genitourinary: No breast swelling, tenderness, discharge or bleeding.  Musculoskeletal: Normal range of motion. She exhibits no edema and no tenderness.  Lymphadenopathy:    She has no cervical adenopathy.  Neurological: She is alert. She has normal reflexes. No cranial nerve deficit. She exhibits normal muscle tone. Coordination normal.  Ataxia and slurred speech baseline  Walks with a walker    Skin: Skin is warm and dry. No rash noted. No erythema. No pallor.  Psychiatric: She has a normal mood and affect.          Assessment & Plan:

## 2013-11-26 NOTE — Patient Instructions (Signed)
If you are interested in a shingles/zoster vaccine - call your insurance to check on coverage,( you should not get it within 1 month of other vaccines) , then call us for a prescription  for it to take to a pharmacy that gives the shot , or make a nurse visit to get it here depending on your coverage  flu vaccine today  Please do the IFOB stool card for colon cancer screening  Let me know in the spring when you can afford the welchol and we will send it to your pharmacy

## 2013-11-26 NOTE — Progress Notes (Signed)
Pre-visit discussion using our clinic review tool. No additional management support is needed unless otherwise documented below in the visit note.  

## 2013-11-28 NOTE — Assessment & Plan Note (Signed)
BP: 132/80 mmHg  bp in fair control at this time  No changes needed Disc lifstyle change with low sodium diet and exercise

## 2013-11-28 NOTE — Assessment & Plan Note (Signed)
D/w patient re:options for colon cancer screening, including IFOB vs. colonoscopy.  Risks and benefits of both were discussed and patient voiced understanding.  Pt elects for:IFOB card  

## 2013-11-28 NOTE — Assessment & Plan Note (Signed)
D level is ok  Continue current dose

## 2013-11-28 NOTE — Assessment & Plan Note (Signed)
Reviewed health habits including diet and exercise and skin cancer prevention Reviewed appropriate screening tests for age  Also reviewed health mt list, fam hx and immunization status , as well as social and family history    Given IFOB card Labs reviewed

## 2013-12-01 ENCOUNTER — Encounter: Payer: Medicare PPO | Admitting: Family Medicine

## 2013-12-17 ENCOUNTER — Encounter: Payer: Self-pay | Admitting: Family Medicine

## 2013-12-22 ENCOUNTER — Ambulatory Visit: Payer: Medicare PPO | Attending: Neurology | Admitting: Rehabilitative and Restorative Service Providers"

## 2013-12-22 DIAGNOSIS — IMO0001 Reserved for inherently not codable concepts without codable children: Secondary | ICD-10-CM | POA: Insufficient documentation

## 2013-12-22 DIAGNOSIS — G119 Hereditary ataxia, unspecified: Secondary | ICD-10-CM | POA: Insufficient documentation

## 2013-12-22 DIAGNOSIS — Z9181 History of falling: Secondary | ICD-10-CM | POA: Insufficient documentation

## 2013-12-22 DIAGNOSIS — R269 Unspecified abnormalities of gait and mobility: Secondary | ICD-10-CM | POA: Insufficient documentation

## 2014-01-11 ENCOUNTER — Ambulatory Visit: Payer: Medicare PPO | Attending: Neurology | Admitting: Rehabilitative and Restorative Service Providers"

## 2014-01-11 DIAGNOSIS — R269 Unspecified abnormalities of gait and mobility: Secondary | ICD-10-CM | POA: Insufficient documentation

## 2014-01-11 DIAGNOSIS — Z9181 History of falling: Secondary | ICD-10-CM | POA: Insufficient documentation

## 2014-01-11 DIAGNOSIS — G119 Hereditary ataxia, unspecified: Secondary | ICD-10-CM | POA: Insufficient documentation

## 2014-01-11 DIAGNOSIS — IMO0001 Reserved for inherently not codable concepts without codable children: Secondary | ICD-10-CM | POA: Insufficient documentation

## 2014-01-13 ENCOUNTER — Ambulatory Visit: Payer: Medicare PPO | Admitting: Physical Therapy

## 2014-01-18 ENCOUNTER — Ambulatory Visit: Payer: Medicare PPO | Admitting: Physical Therapy

## 2014-01-21 ENCOUNTER — Ambulatory Visit: Payer: Medicare PPO

## 2014-01-25 ENCOUNTER — Encounter: Payer: Medicare PPO | Admitting: Physical Therapy

## 2014-01-26 ENCOUNTER — Ambulatory Visit: Payer: Medicare PPO | Admitting: Physical Therapy

## 2014-01-27 ENCOUNTER — Encounter: Payer: Medicare PPO | Admitting: Rehabilitative and Restorative Service Providers"

## 2014-01-30 IMAGING — CR DG KNEE COMPLETE 4+V*L*
5 series · 5 of 5 positions shown · non-contrast
Comparison: None

CLINICAL DATA: Left knee pain and swelling

LEFT KNEE - COMPLETE 4+ VIEW

[view not recorded (1 of 5)]
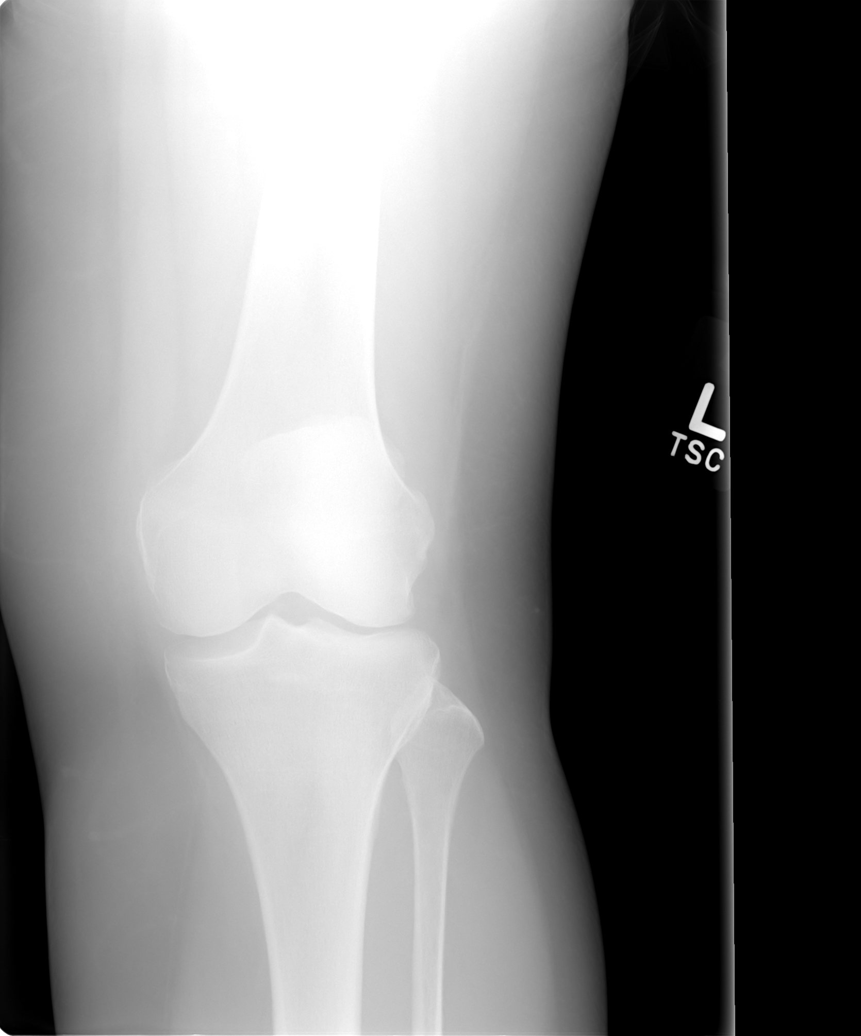

[view not recorded (2 of 5)]
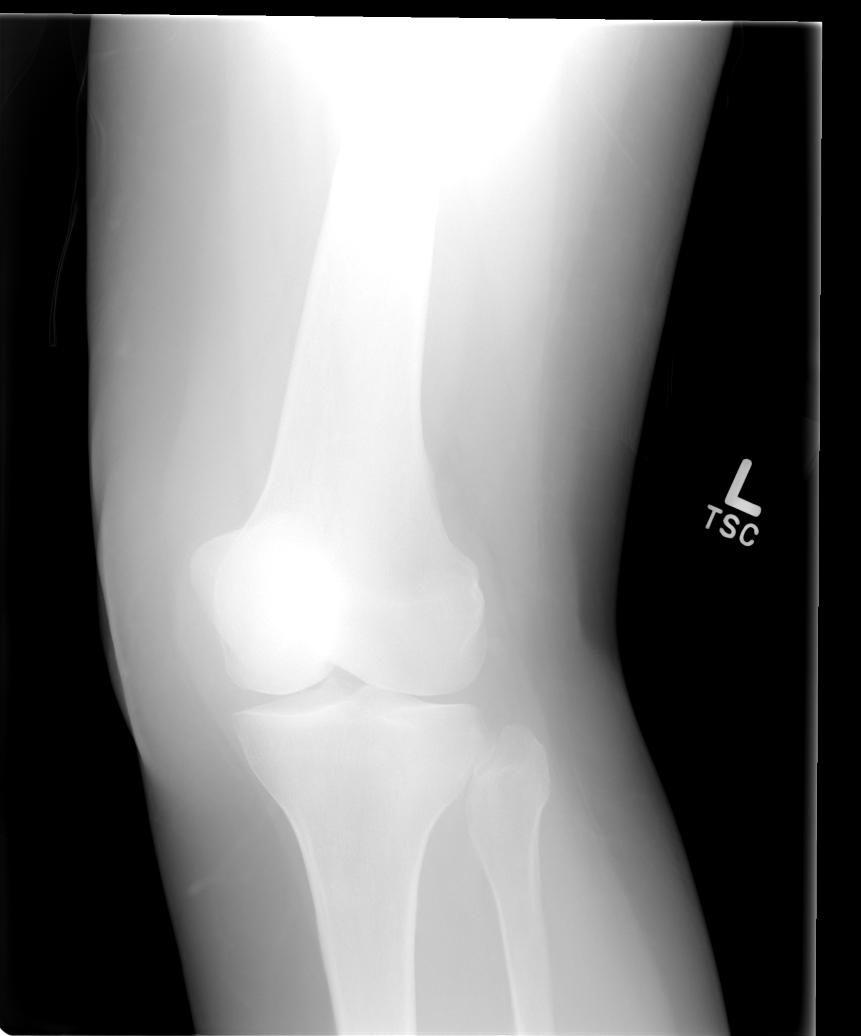

[view not recorded (3 of 5)]
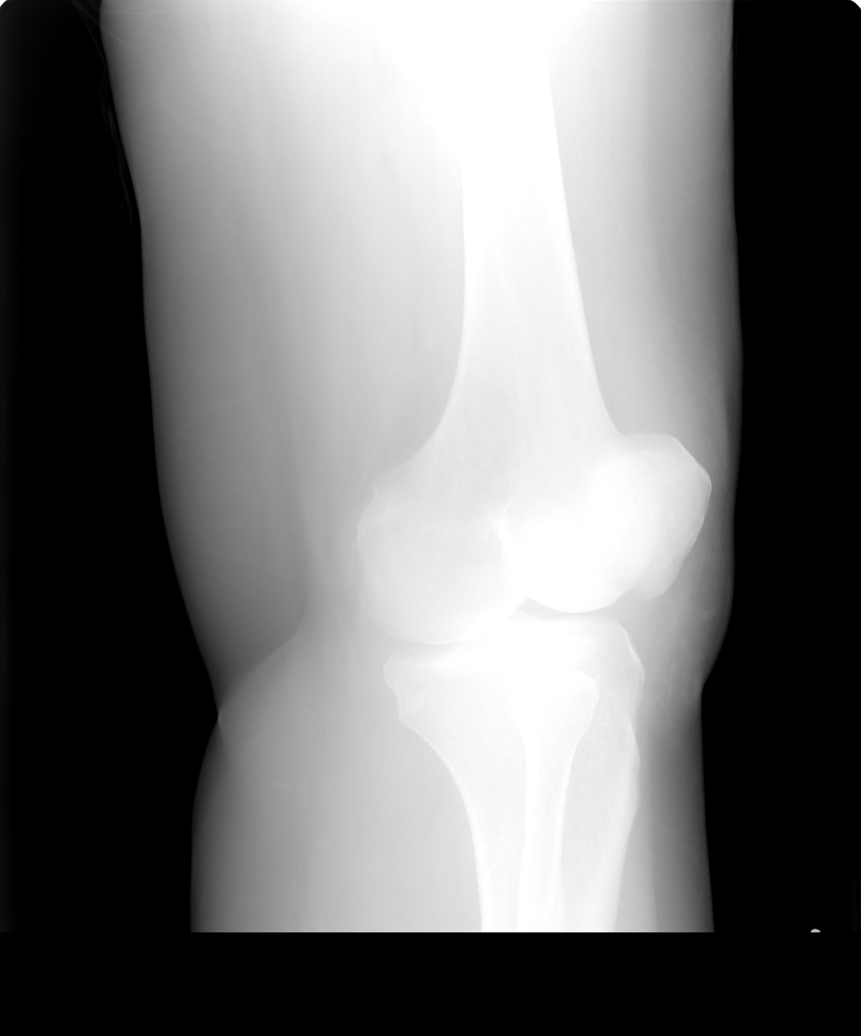

[view not recorded (4 of 5)]
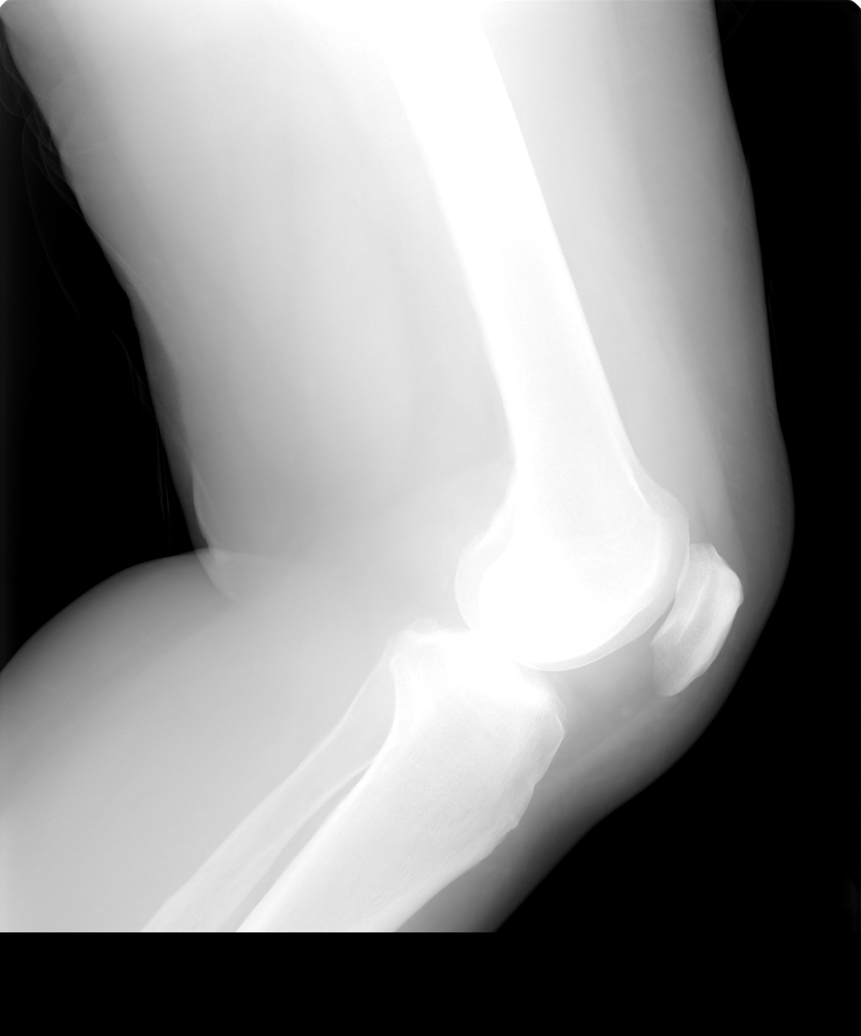

[view not recorded (5 of 5)]
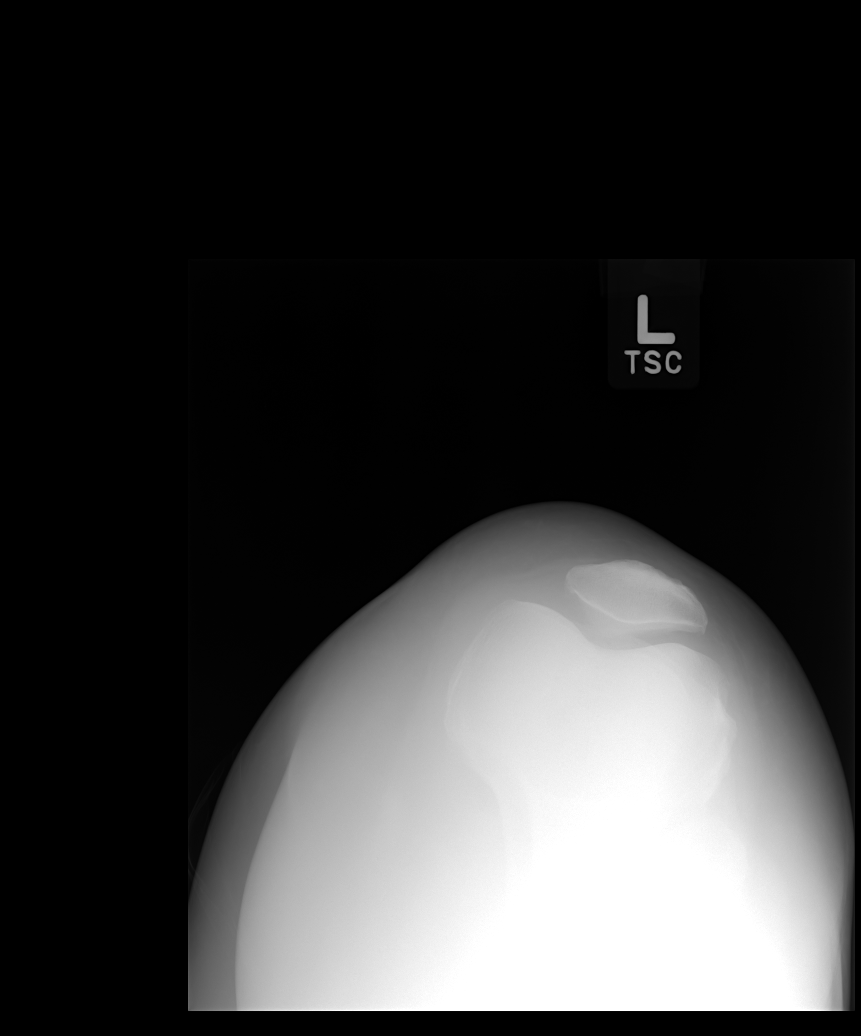

[5 of 5 positions shown; findings below may reference images not displayed]

FINDINGS: Osseous mineralization normal.
Mild lateral compartment joint space narrowing.
Remaining joint spaces grossly preserved.
No acute fracture, dislocation or bone destruction.
Small knee joint effusion present.
Soft tissues otherwise unremarkable.
IMPRESSION: Minimal degenerative changes and small knee joint effusion.

## 2014-02-01 ENCOUNTER — Ambulatory Visit: Payer: Medicare PPO | Attending: Neurology

## 2014-02-01 DIAGNOSIS — R269 Unspecified abnormalities of gait and mobility: Secondary | ICD-10-CM | POA: Insufficient documentation

## 2014-02-01 DIAGNOSIS — IMO0001 Reserved for inherently not codable concepts without codable children: Secondary | ICD-10-CM | POA: Insufficient documentation

## 2014-02-01 DIAGNOSIS — G119 Hereditary ataxia, unspecified: Secondary | ICD-10-CM | POA: Insufficient documentation

## 2014-02-01 DIAGNOSIS — Z9181 History of falling: Secondary | ICD-10-CM | POA: Insufficient documentation

## 2014-02-03 ENCOUNTER — Ambulatory Visit: Payer: Medicare PPO | Admitting: Physical Therapy

## 2014-02-08 ENCOUNTER — Ambulatory Visit: Payer: Medicare PPO | Admitting: Physical Therapy

## 2014-02-10 ENCOUNTER — Ambulatory Visit: Payer: Medicare PPO | Admitting: Rehabilitative and Restorative Service Providers"

## 2014-02-24 ENCOUNTER — Encounter: Payer: Self-pay | Admitting: Family Medicine

## 2014-02-24 ENCOUNTER — Other Ambulatory Visit (INDEPENDENT_AMBULATORY_CARE_PROVIDER_SITE_OTHER): Payer: Medicare PPO

## 2014-02-24 DIAGNOSIS — Z1211 Encounter for screening for malignant neoplasm of colon: Secondary | ICD-10-CM

## 2014-02-24 LAB — FECAL OCCULT BLOOD, IMMUNOCHEMICAL: Fecal Occult Bld: NEGATIVE

## 2014-04-12 ENCOUNTER — Encounter: Payer: Self-pay | Admitting: Neurology

## 2014-07-13 ENCOUNTER — Ambulatory Visit: Payer: Medicare PPO | Admitting: Neurology

## 2014-08-29 ENCOUNTER — Telehealth: Payer: Self-pay | Admitting: Family Medicine

## 2014-08-29 MED ORDER — ZOSTER VACCINE LIVE 19400 UNT/0.65ML ~~LOC~~ SOLR: 0.6500 mL | Freq: Once | SUBCUTANEOUS | Status: DC

## 2014-08-29 NOTE — Telephone Encounter (Signed)
Pt requests written px to take to [facility] for shingles vaccine

## 2014-08-30 NOTE — Telephone Encounter (Signed)
Spouse notified Rx for vaccine ready for pick-up

## 2014-10-07 ENCOUNTER — Other Ambulatory Visit: Payer: Self-pay

## 2014-10-07 MED ORDER — METOPROLOL SUCCINATE ER 50 MG PO TB24: ORAL_TABLET | ORAL | Status: DC

## 2014-10-07 MED ORDER — HYDROCHLOROTHIAZIDE 25 MG PO TABS: 25.0000 mg | ORAL_TABLET | Freq: Every day | ORAL | Status: DC

## 2014-10-07 NOTE — Telephone Encounter (Signed)
That is fine-please refill as requested

## 2014-10-07 NOTE — Telephone Encounter (Signed)
Rxs sent and husband notified

## 2014-10-07 NOTE — Telephone Encounter (Signed)
pts husband came by office; pt has one HCTZ pill left; Mr Saar request HCTZ and metoprolol 90 day rx x1 refill to [facility] mail order pharmacy; also request HCTZ # 30 to [facility] while waiting on mail order refill. Pt has enough metoprolol to wait until mail order comes. Pt last seen 11/26/2013 and pt has CPX scheduled 03/13/2015. Mr Dizdarevic request cb today.

## 2014-10-21 ENCOUNTER — Encounter: Payer: Self-pay | Admitting: Family Medicine

## 2014-10-25 ENCOUNTER — Ambulatory Visit (INDEPENDENT_AMBULATORY_CARE_PROVIDER_SITE_OTHER): Payer: Medicare PPO | Admitting: Neurology

## 2014-10-25 ENCOUNTER — Encounter: Payer: Self-pay | Admitting: Neurology

## 2014-10-25 VITALS — BP 153/79 | HR 72 | Ht 64.0 in | Wt 178.2 lb

## 2014-10-25 DIAGNOSIS — G118 Other hereditary ataxias: Secondary | ICD-10-CM

## 2014-10-25 DIAGNOSIS — R269 Unspecified abnormalities of gait and mobility: Secondary | ICD-10-CM

## 2014-10-25 NOTE — Progress Notes (Signed)
Reason for visit: Gait ataxia  Jaime Mccarthy is an 73 y.o. female  History of present illness:  Jaime Mccarthy is a 73 year old right-handed white female with a history of a spinocerebellar ataxia syndrome. 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 The patient has had a progressive change in her walking, she is using a walker for ambulation. Five years ago, she was only using a cane. She has had occasional falls, but fortunately, she has fallen backwards into a chair or couch. The patient has not had any injuries. She has undergone physical therapy for her gait with some benefit. She reports some issues with her speech and some occasional choking with swallowing. She returns for an evaluation. The patient has one daughter age 82, the daughter does not have children and does not plan on having children.  Past Medical History  Diagnosis Date  . Allergy     allergic rhinitis seasonal  . Hypertension   . Hyperlipidemia   . Ataxia     degenerative cerebellar  . Gait disorder   . Dyslipidemia   . 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 Degenerative arthritis      Left knee    Past Surgical History  Procedure Laterality Date  . Breast surgery      left breast needle biopsy negative  . Gallbladder surgery      Family History  Problem Relation Age of Onset  . COPD Mother   . Cancer Mother     breast Ca  . Heart disease Father     CAD  . Cancer Father     lung CA  . Depression Daughter   . Heart disease Maternal Grandmother     heart failure    Social history:  reports that she has quit smoking. She has never used smokeless tobacco. She reports that she drinks alcohol. She reports that she does not use illicit drugs.   No Known Allergies  Medications:  Current Outpatient Prescriptions on File Prior to Visit  Medication Sig Dispense Refill  . aspirin 325 MG tablet Take 325 mg by mouth daily.     . Flaxseed, Linseed, (FLAX SEED OIL) 1000 MG CAPS Take 2 capsules by mouth daily.      . hydrochlorothiazide (HYDRODIURIL) 25 MG tablet Take 1 tablet (25 mg total) by mouth daily. 90 tablet 1  . metoprolol succinate (TOPROL-XL) 50 MG 24 hr tablet Take 1/2 pill by mouth once daily 45 tablet 1  . zoster vaccine live, PF, (ZOSTAVAX) 32671 UNT/0.65ML injection Inject 19,400 Units into the skin once. 1 vial 0   No current facility-administered medications on file prior to visit.    ROS:  Out of a complete 14 system review of symptoms, the patient complains only of the following symptoms, and all other reviewed systems are negative.  Speech difficulty  Blood pressure 153/79, pulse 72, height 5\' 4"  (1.626 m), weight 178 lb 3.2 oz (80.831 kg).  Physical Exam  General: The patient is alert and cooperative at the time of the examination.  Skin: No significant peripheral edema is noted.   Neurologic Exam  Mental status: The patient is oriented x 3.  Cranial nerves: Facial symmetry is present. Speech is dysarthric, ataxic. Extraocular movements are full. Visual fields are full.  Motor: The patient has good strength in all 4 extremities.  Sensory examination: Soft touch sensation  is symmetric on the face, arms, and legs.  Coordination: The patient has dysmetria with finger-nose-finger and heel-to-shin bilaterally.  Gait and station: The patient has a slow, ataxic gait, wide-based. The patient uses a walker for ambulation. Tandem gait was not attempted. Romberg is negative. No drift is seen.  Reflexes: Deep tendon reflexes are symmetric.   Assessment/Plan:  1. Spinocerebellar ataxia  2. Gait disorder  3. Dysarthria, dysphagia  The patient will be followed conservatively over time, she will follow-up in one year. If any new issues arise, she is to contact our office.  Jill Alexanders MD 10/25/2014 6:52 PM  [facility] [address] [place], [place] [address]  Phone [phone] Fax [phone]

## 2014-10-25 NOTE — Patient Instructions (Signed)

## 2015-02-17 DIAGNOSIS — H25013 Cortical age-related cataract, bilateral: Secondary | ICD-10-CM | POA: Diagnosis not present

## 2015-02-17 DIAGNOSIS — H02052 Trichiasis without entropian right lower eyelid: Secondary | ICD-10-CM | POA: Diagnosis not present

## 2015-02-21 ENCOUNTER — Telehealth: Payer: Self-pay

## 2015-02-21 NOTE — Telephone Encounter (Signed)
Pt declined flu vaccine

## 2015-03-02 ENCOUNTER — Telehealth: Payer: Self-pay | Admitting: Family Medicine

## 2015-03-02 DIAGNOSIS — E7800 Pure hypercholesterolemia, unspecified: Secondary | ICD-10-CM

## 2015-03-02 DIAGNOSIS — I10 Essential (primary) hypertension: Secondary | ICD-10-CM

## 2015-03-02 DIAGNOSIS — E559 Vitamin D deficiency, unspecified: Secondary | ICD-10-CM

## 2015-03-02 NOTE — Telephone Encounter (Signed)
-----   Message from Marchia Bond sent at 02/27/2015  4:17 PM EDT ----- Regarding: Cpx labs Fri 03/03/15 Please order  future cpx labs for pt's upcoming lab appt. Thanks Aniceto Boss

## 2015-03-03 ENCOUNTER — Other Ambulatory Visit (INDEPENDENT_AMBULATORY_CARE_PROVIDER_SITE_OTHER): Payer: Medicare PPO

## 2015-03-03 DIAGNOSIS — E7800 Pure hypercholesterolemia, unspecified: Secondary | ICD-10-CM

## 2015-03-03 DIAGNOSIS — E559 Vitamin D deficiency, unspecified: Secondary | ICD-10-CM

## 2015-03-03 DIAGNOSIS — I10 Essential (primary) hypertension: Secondary | ICD-10-CM

## 2015-03-03 LAB — COMPREHENSIVE METABOLIC PANEL
ALK PHOS: 96 U/L (ref 39–117)
ALT: 31 U/L (ref 0–35)
AST: 26 U/L (ref 0–37)
Albumin: 4.5 g/dL (ref 3.5–5.2)
BUN: 15 mg/dL (ref 6–23)
CO2: 33 mEq/L — ABNORMAL HIGH (ref 19–32)
CREATININE: 0.82 mg/dL (ref 0.40–1.20)
Calcium: 10.3 mg/dL (ref 8.4–10.5)
Chloride: 99 mEq/L (ref 96–112)
GFR: 72.97 mL/min (ref >60.00)
Glucose, Bld: 90 mg/dL (ref 70–99)
Potassium: 3.6 mEq/L (ref 3.5–5.1)
Sodium: 138 mEq/L (ref 135–145)
TOTAL PROTEIN: 7.9 g/dL (ref 6.0–8.3)
Total Bilirubin: 0.6 mg/dL (ref 0.2–1.2)

## 2015-03-03 LAB — TSH: TSH: 3.9 u[IU]/mL (ref 0.35–4.50)

## 2015-03-03 LAB — CBC WITH DIFFERENTIAL/PLATELET
BASOS ABS: 0 10*3/uL (ref 0.0–0.1)
Basophils Relative: 0.3 % (ref 0.0–3.0)
EOS ABS: 0.1 10*3/uL (ref 0.0–0.7)
Eosinophils Relative: 1.5 % (ref 0.0–5.0)
HEMATOCRIT: 45.6 % (ref 36.0–46.0)
Hemoglobin: 15.1 g/dL — ABNORMAL HIGH (ref 12.0–15.0)
LYMPHS ABS: 3.1 10*3/uL (ref 0.7–4.0)
Lymphocytes Relative: 35.4 % (ref 12.0–46.0)
MCHC: 33 g/dL (ref 30.0–36.0)
MCV: 88.7 fl (ref 78.0–100.0)
MONO ABS: 0.6 10*3/uL (ref 0.1–1.0)
Monocytes Relative: 7.5 % (ref 3.0–12.0)
NEUTROS ABS: 4.8 10*3/uL (ref 1.4–7.7)
Neutrophils Relative %: 55.3 % (ref 43.0–77.0)
PLATELETS: 304 10*3/uL (ref 150.0–400.0)
RBC: 5.14 Mil/uL — AB (ref 3.87–5.11)
RDW: 15.1 % (ref 11.5–15.5)
WBC: 8.6 10*3/uL (ref 4.0–10.5)

## 2015-03-03 LAB — VITAMIN D 25 HYDROXY (VIT D DEFICIENCY, FRACTURES): VITD: 26.88 ng/mL — ABNORMAL LOW (ref 30.00–100.00)

## 2015-03-03 LAB — LIPID PANEL
CHOLESTEROL: 263 mg/dL — AB (ref 0–200)
HDL: 62 mg/dL (ref >39.00)
LDL Cholesterol: 175 mg/dL — ABNORMAL HIGH (ref 0–99)
NonHDL: 201
TRIGLYCERIDES: 132 mg/dL (ref 0.0–149.0)
Total CHOL/HDL Ratio: 4
VLDL: 26.4 mg/dL (ref 0.0–40.0)

## 2015-03-09 ENCOUNTER — Telehealth: Payer: Self-pay

## 2015-03-09 NOTE — Telephone Encounter (Signed)
Left voicemail asking the patient to call back to reschedule November appointment, since Dr. Jannifer Franklin will be out of the office.

## 2015-03-13 ENCOUNTER — Other Ambulatory Visit: Payer: Self-pay

## 2015-03-13 ENCOUNTER — Encounter: Payer: Self-pay | Admitting: Family Medicine

## 2015-03-13 ENCOUNTER — Ambulatory Visit (INDEPENDENT_AMBULATORY_CARE_PROVIDER_SITE_OTHER): Payer: Medicare PPO | Admitting: Family Medicine

## 2015-03-13 VITALS — BP 138/88 | HR 72 | Temp 98.0°F | Ht 64.0 in | Wt 176.1 lb

## 2015-03-13 DIAGNOSIS — E559 Vitamin D deficiency, unspecified: Secondary | ICD-10-CM | POA: Diagnosis not present

## 2015-03-13 DIAGNOSIS — Z23 Encounter for immunization: Secondary | ICD-10-CM

## 2015-03-13 DIAGNOSIS — E7800 Pure hypercholesterolemia, unspecified: Secondary | ICD-10-CM

## 2015-03-13 DIAGNOSIS — Z0000 Encounter for general adult medical examination without abnormal findings: Secondary | ICD-10-CM

## 2015-03-13 DIAGNOSIS — I10 Essential (primary) hypertension: Secondary | ICD-10-CM

## 2015-03-13 DIAGNOSIS — Z1211 Encounter for screening for malignant neoplasm of colon: Secondary | ICD-10-CM

## 2015-03-13 NOTE — Assessment & Plan Note (Signed)
bp in fair control at this time  BP Readings from Last 1 Encounters:  03/13/15 138/88   No changes needed Disc lifstyle change with low sodium diet and exercise  Labs reviewed  Medicines refilled

## 2015-03-13 NOTE — Patient Instructions (Addendum)
Tetanus shot today  prevnar shot today (pneumonia vaccine)  Please do stool cards for colon cancer screening Avoid red meat/ fried foods/ egg yolks/ fatty breakfast meats/ butter, cheese and high fat dairy/ and shellfish   Take 2000 iu of vitamin D3 over the counter daily  Take care of yourself

## 2015-03-13 NOTE — Assessment & Plan Note (Signed)
Level is 26 Not on D Disc imp to bone and gen health Will add 2000 iu daily

## 2015-03-13 NOTE — Assessment & Plan Note (Signed)
Cannot take statins at all  Disc goals for lipids and reasons to control them Rev labs with pt Rev low sat fat diet in detail ? If may be candidate for med like Repatha when it is affordable  Re emph imp of low sat fat diet

## 2015-03-13 NOTE — Telephone Encounter (Signed)
[employer] left v/m received rx for metoprolol and HCTZ; but could not read last name of pt. [employer] request rx recent; do not see where recent rx was sent.Please advise.

## 2015-03-13 NOTE — Assessment & Plan Note (Signed)
Reviewed health habits including diet and exercise and skin cancer prevention Reviewed appropriate screening tests for age  Also reviewed health mt list, fam hx and immunization status , as well as social and family history   See HPI Labs reviewed Td today prevnar today Rev fall precautions in detail (in light of cerebellar ataxia) and has done PT for fall prevention as well , using walker  Will inc her D intake by 2000 iu daily  Last dexa nl in 2010 and no fx  ifob for colon cancer screening No memory issues

## 2015-03-13 NOTE — Telephone Encounter (Signed)
Appointment rescheduled to 12/05/15.

## 2015-03-13 NOTE — Assessment & Plan Note (Signed)
Declines colonosc due to neurologic problems Given IFOB card

## 2015-03-13 NOTE — Progress Notes (Signed)
Pre visit review using our clinic review tool, if applicable. No additional management support is needed unless otherwise documented below in the visit note. 

## 2015-03-13 NOTE — Progress Notes (Signed)
Subjective:    Patient ID: Jaime Mccarthy, female    DOB: 10-Jun-1941, 74 y.o.   MRN: [id number]  HPI Here for annual medicare wellness visit as well as chronic/acute medical problems   I have personally reviewed the Medicare Annual Wellness questionnaire and have noted 1. The patient's medical and social history 2. Their use of alcohol, tobacco or illicit drugs 3. Their current medications and supplements 4. The patient's functional ability including ADL's, fall risks, home safety risks and hearing or visual             impairment. 5. Diet and physical activities 6. Evidence for depression or mood disorders  The patients weight, height, BMI have been recorded in the chart and visual acuity is per eye clinic.  I have made referrals, counseling and provided education to the patient based review of the above and I have provided the pt with a written personalized care plan for preventive services.  See scanned forms.  Routine anticipatory guidance given to patient.  See health maintenance. Colon cancer screening nl 12/04 - declines further , stool card nl 3/15 and will do another ifob  Breast cancer screening 11/15 - up to date  Self breast exam- has not noticed any lumps  Flu vaccine - she missed her flu vaccine by accident this fall/ last 11/14 (she will make sure she will get one next fall)  Tetanus vaccine - wants to get it here regardless of cost  No gyn symptoms or problems  Pneumovax 12/13, due for prevnar  Zoster vaccine 10/15   Advance directive- has a living will and POA  Cognitive function addressed- see scanned forms- and if abnormal then additional documentation follows.  Think her memory is fine   PMH and SH reviewed  Meds, vitals, and allergies reviewed.   ROS: See HPI.  Otherwise negative.    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 - sees neuro to check in  Went to PT  Using a walker  Still had some falls - - is as careful as she can be - and working with  PT on that  No broken bones   bp is stable today  No cp or palpitations or headaches or edema  No side effects to medicines  BP Readings from Last 3 Encounters:  03/13/15 138/88  10/25/14 153/79  11/26/13 132/80     Wt is down 2 lb with bmi of 30 Eating fairly healthy/ not all the time    dexa nl 10/03-had it checked in 2010 - by a machine brought into replacements and it was nl also  D level 26 Not taking any right now   Hyperlipidemia Cannot take statins at all  Lab Results  Component Value Date   CHOL 263* 03/03/2015   CHOL 250* 11/18/2013   CHOL 222* 09/25/2012   Lab Results  Component Value Date   HDL 62.00 03/03/2015   HDL 56.60 11/18/2013   HDL 59.90 09/25/2012   Lab Results  Component Value Date   LDLCALC 175* 03/03/2015   LDLCALC 153* 10/04/2008   Lab Results  Component Value Date   TRIG 132.0 03/03/2015   TRIG 123.0 11/18/2013   TRIG 94.0 09/25/2012   Lab Results  Component Value Date   CHOLHDL 4 03/03/2015   CHOLHDL 4 11/18/2013   CHOLHDL 4 09/25/2012   Lab Results  Component Value Date   LDLDIRECT 186.5 11/18/2013   LDLDIRECT 147.0 09/25/2012   LDLDIRECT 133.8 03/03/2012   Cannot  have the statin medicines  Pretty stable  Diet could always be better     Chemistry      Component Value Date/Time   NA 138 03/03/2015 1019   K 3.6 03/03/2015 1019   CL 99 03/03/2015 1019   CO2 33* 03/03/2015 1019   BUN 15 03/03/2015 1019   CREATININE 0.82 03/03/2015 1019      Component Value Date/Time   CALCIUM 10.3 03/03/2015 1019   ALKPHOS 96 03/03/2015 1019   AST 26 03/03/2015 1019   ALT 31 03/03/2015 1019   BILITOT 0.6 03/03/2015 1019      Lab Results  Component Value Date   TSH 3.90 03/03/2015   Lab Results  Component Value Date   WBC 8.6 03/03/2015   HGB 15.1* 03/03/2015   HCT 45.6 03/03/2015   MCV 88.7 03/03/2015   PLT 304.0 03/03/2015    Patient Active Problem List   Diagnosis Date Noted  . 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  Encounter for Medicare annual wellness exam 11/26/2013  . Colon cancer screening 11/26/2013  . 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 Left knee pain 05/18/2013  . Abnormality of gait 01/26/2013  . Vitamin D deficiency 01/04/2009  . ATAXIA, CEREBELLAR NEC 05/07/2007  . DISORDER, 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  . FIBROIDS, UTERUS 03/16/2007  . HYPERCHOLESTEROLEMIA 03/16/2007  . Essential hypertension 03/16/2007  . ALLERGIC RHINITIS 03/16/2007  . FIBROCYSTIC BREAST DISEASE 03/16/2007  . HEART MURMUR, HX OF 03/16/2007   Past Medical History  Diagnosis Date  . Allergy     allergic rhinitis seasonal  . Hypertension   . Hyperlipidemia   . Ataxia     degenerative cerebellar  . Gait disorder   . Dyslipidemia   . 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 Degenerative arthritis      Left knee   Past Surgical History  Procedure Laterality Date  . Breast surgery      left breast needle biopsy negative  . Gallbladder surgery     History  Substance Use Topics  . Smoking status: Former Research scientist (life sciences)  . Smokeless tobacco: Never Used  . Alcohol Use: Yes     Comment: occasional   Family History  Problem Relation Age of Onset  . COPD Mother   . Cancer Mother     breast Ca  . Heart disease Father     CAD  . Cancer Father     lung CA  . Depression Daughter   . Heart disease Maternal Grandmother     heart failure   No Known Allergies Current Outpatient Prescriptions on File Prior to Visit  Medication Sig Dispense Refill  . aspirin 325 MG tablet Take 325 mg by mouth daily.    . Flaxseed, Linseed, (FLAX SEED OIL) 1000 MG CAPS Take 2 capsules by mouth daily.      . hydrochlorothiazide (HYDRODIURIL) 25 MG tablet Take 1 tablet (25 mg total) by mouth daily. 90 tablet 1  . metoprolol succinate (TOPROL-XL) 50 MG 24 hr tablet Take 1/2 pill by mouth once daily 45 tablet 1  . MULTIPLE VITAMIN PO Take by mouth daily.    Marland Kitchen zoster vaccine live, PF, (ZOSTAVAX) 28786 UNT/0.65ML injection Inject 19,400 Units into the  skin once. 1 vial 0   No current facility-administered medications on file prior to visit.     Review of Systems Review of Systems  Constitutional: Negative for fever, appetite change, fatigue and unexpected weight change.  Eyes: Negative for pain and visual disturbance.  Respiratory: Negative for cough and  shortness of breath.   Cardiovascular: Negative for cp or palpitations    Gastrointestinal: Negative for nausea, diarrhea and constipation.  Genitourinary: Negative for urgency and frequency.  Skin: Negative for pallor or rash   Neurological: Negative for weakness, light-headedness, numbness and headaches. pos for ataxia /poor balance and slow speech Hematological: Negative for adenopathy. Does not bruise/bleed easily.  Psychiatric/Behavioral: Negative for dysphoric mood. The patient is not nervous/anxious.         Objective:   Physical Exam  Constitutional: She appears well-developed and well-nourished. No distress.  obese and well appearing   HENT:  Head: Normocephalic and atraumatic.  Right Ear: External ear normal.  Left Ear: External ear normal.  Mouth/Throat: Oropharynx is clear and moist.  Eyes: Conjunctivae and EOM are normal. Pupils are equal, round, and reactive to light. No scleral icterus.  Neck: Normal range of motion. Neck supple. No JVD present. Carotid bruit is not present. No thyromegaly present.  Cardiovascular: Normal rate, regular rhythm, normal heart sounds and intact distal pulses.  Exam reveals no gallop.   Pulmonary/Chest: Effort normal and breath sounds normal. No respiratory distress. She has no wheezes. She exhibits no tenderness.  Abdominal: Soft. Bowel sounds are normal. She exhibits no distension, no abdominal bruit and no mass. There is no tenderness.  Genitourinary: No breast swelling, tenderness, discharge or bleeding.  Breast exam: No mass, nodules, thickening, tenderness, bulging, retraction, inflamation, nipple discharge or skin changes noted.   No axillary or clavicular LA.       Musculoskeletal: Normal range of motion. She exhibits no edema or tenderness.  Lymphadenopathy:    She has no cervical adenopathy.  Neurological: She is alert. She has normal reflexes. She displays no atrophy. No cranial nerve deficit or sensory deficit. She exhibits normal muscle tone. Coordination and gait abnormal.  Skin: Skin is warm and dry. No rash noted. No erythema. No pallor.  Psychiatric: She has a normal mood and affect.          Assessment & Plan:   Problem List Items Addressed This Visit      Cardiovascular and Mediastinum   Essential hypertension    bp in fair control at this time  BP Readings from Last 1 Encounters:  03/13/15 138/88   No changes needed Disc lifstyle change with low sodium diet and exercise  Labs reviewed  Medicines refilled         Other   Colon cancer screening    Declines colonosc due to neurologic problems Given IFOB card       Relevant Orders   Fecal occult blood, imunochemical   Encounter for Medicare annual wellness exam - Primary    Reviewed health habits including diet and exercise and skin cancer prevention Reviewed appropriate screening tests for age  Also reviewed health mt list, fam hx and immunization status , as well as social and family history   See HPI Labs reviewed Td today prevnar today Rev fall precautions in detail (in light of cerebellar ataxia) and has done PT for fall prevention as well , using walker  Will inc her D intake by 2000 iu daily  Last dexa nl in 2010 and no fx  ifob for colon cancer screening No memory issues       Relevant Orders   Pneumococcal conjugate vaccine 13-valent IM (Completed)   Td : Tetanus/diphtheria >7yo Preservative  free (Completed)   HYPERCHOLESTEROLEMIA    Cannot take statins at all  Disc goals for lipids and reasons to  control them Rev labs with pt Rev low sat fat diet in detail ? If may be candidate for med like Repatha when it is  affordable  Re emph imp of low sat fat diet       Vitamin D deficiency    Level is 26 Not on D Disc imp to bone and gen health Will add 2000 iu daily       Other Visit Diagnoses    Need for prophylactic vaccination against Streptococcus pneumoniae (pneumococcus)        Relevant Orders    Pneumococcal conjugate vaccine 13-valent IM (Completed)    Need for prophylactic vaccination with tetanus-diphtheria (TD)        Relevant Orders    Td : Tetanus/diphtheria >7yo Preservative  free (Completed)

## 2015-03-14 MED ORDER — HYDROCHLOROTHIAZIDE 25 MG PO TABS: 25.0000 mg | ORAL_TABLET | Freq: Every day | ORAL | Status: DC

## 2015-03-14 MED ORDER — METOPROLOL SUCCINATE ER 50 MG PO TB24: ORAL_TABLET | ORAL | Status: DC

## 2015-03-14 NOTE — Telephone Encounter (Signed)
Metoprolol and HCTZ sent electronically to pharmacy.

## 2015-09-29 ENCOUNTER — Encounter: Payer: Self-pay | Admitting: Family Medicine

## 2015-10-04 DIAGNOSIS — L821 Other seborrheic keratosis: Secondary | ICD-10-CM | POA: Diagnosis not present

## 2015-10-04 DIAGNOSIS — Z85820 Personal history of malignant melanoma of skin: Secondary | ICD-10-CM | POA: Diagnosis not present

## 2015-10-24 ENCOUNTER — Ambulatory Visit: Payer: Medicare PPO | Admitting: Neurology

## 2015-11-28 DIAGNOSIS — Z803 Family history of malignant neoplasm of breast: Secondary | ICD-10-CM | POA: Diagnosis not present

## 2015-11-28 DIAGNOSIS — Z1231 Encounter for screening mammogram for malignant neoplasm of breast: Secondary | ICD-10-CM | POA: Diagnosis not present

## 2015-11-29 ENCOUNTER — Encounter: Payer: Self-pay | Admitting: Family Medicine

## 2015-12-05 ENCOUNTER — Encounter: Payer: Self-pay | Admitting: Neurology

## 2015-12-05 ENCOUNTER — Ambulatory Visit (INDEPENDENT_AMBULATORY_CARE_PROVIDER_SITE_OTHER): Payer: Medicare PPO | Admitting: Neurology

## 2015-12-05 VITALS — BP 163/79 | HR 77 | Ht 64.0 in | Wt 177.5 lb

## 2015-12-05 DIAGNOSIS — G118 Other hereditary ataxias: Secondary | ICD-10-CM

## 2015-12-05 DIAGNOSIS — R269 Unspecified abnormalities of gait and mobility: Secondary | ICD-10-CM | POA: Diagnosis not present

## 2015-12-05 DIAGNOSIS — F482 Pseudobulbar affect: Secondary | ICD-10-CM | POA: Diagnosis not present

## 2015-12-05 NOTE — Progress Notes (Signed)
Reason for visit:  Spinocerebellar ataxia  Jaime Mccarthy is an 75 y.o. 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 The patient has had ongoing progression of her ability to ambulate, and her ability to use her hands. She has had increasing difficulty with handwriting and with feeding herself. She has lost more dexterity in the hands. Two years ago, she underwent physical therapy for gait training , she has not gone for therapy for her hand ataxia. The patient has had between 3 and 5 falls over the last one year. She had one fall out of a chair when she bent over to pick up something. She uses a walker for ambulation at all times. She has some ataxia of speech, she denies any problems with swallowing or choking. She denies any numbness or tingling sensations on the extremities. She returns for an evaluation. She has one daughter, age 61, who is currently asymptomatic.  Past Medical History  Diagnosis Date  . Allergy     allergic rhinitis seasonal  . Hypertension   . Hyperlipidemia   . Ataxia     degenerative cerebellar  . Gait disorder   . Dyslipidemia   . 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 Degenerative arthritis      Left knee    Past Surgical History  Procedure Laterality Date  . Breast surgery      left breast needle biopsy negative  . Gallbladder surgery      Family History  Problem Relation Age of Onset  . COPD Mother   . Cancer Mother     breast Ca  . Heart disease Father     CAD  . Cancer Father     lung CA  . Depression Daughter   . Heart disease Maternal Grandmother     heart failure    Social history:  reports that she has quit smoking. She has never used smokeless tobacco. She reports that she does not drink alcohol or use illicit drugs.   No Known Allergies  Medications:  Prior to Admission medications   Medication Sig  Start Date End Date Taking? Authorizing Provider  aspirin 325 MG tablet Take 325 mg by mouth daily.   Yes Historical Provider, MD  cholecalciferol (VITAMIN D) 1000 units tablet Take 1,000 Units by mouth daily.   Yes Historical Provider, MD  Flaxseed, Linseed, (FLAX SEED OIL) 1000 MG CAPS Take 2 capsules by mouth daily.     Yes Historical Provider, MD  hydrochlorothiazide (HYDRODIURIL) 25 MG tablet Take 1 tablet (25 mg total) by mouth daily. 03/14/15  Yes Abner Greenspan, MD  ibuprofen (ADVIL,MOTRIN) 600 MG tablet  02/06/15  Yes Historical Provider, MD  metoprolol succinate (TOPROL-XL) 50 MG 24 hr tablet Take 1/2 pill by mouth once daily 03/14/15  Yes Abner Greenspan, MD  MULTIPLE VITAMIN PO Take by mouth daily.   Yes Historical Provider, MD  zoster vaccine live, PF, (ZOSTAVAX) 29562 UNT/0.65ML injection Inject 19,400 Units into the skin once. 08/29/14  Yes Abner Greenspan, MD    ROS:  Out of a complete 14 system review of symptoms, the patient complains only of the following symptoms, and all other reviewed systems are negative.   Activity change , decreased activity  Runny nose  Eye itching  Speech difficulty  Gait instability  Blood pressure 163/79, pulse 77, height 5\' 4"  (1.626 m),  weight 177 lb 8 oz (80.513 kg).  Physical Exam  General: The patient is alert and cooperative at the time of the examination.  Skin: No significant peripheral edema is noted.   Neurologic Exam  Mental status: The patient is alert and oriented x 3 at the time of the examination. The patient has apparent normal recent and remote memory, with an apparently normal attention span and concentration ability.   Cranial nerves: Facial symmetry is present. Speech is ataxic, not aphasic. Extraocular movements are full.  There appears to be evidence of an gaze nystagmus bilaterally. Visual fields are full.  Motor: The patient has good strength in all 4 extremities.  Sensory examination: Soft touch sensation is symmetric  on the face, arms, and legs.  Coordination: The patient has some dysmetria with finger-nose-finger and heel-to-shin bilaterally.  Gait and station: The patient has a wide-based, ataxic gait. The patient walks with the knees locked. Tandem gait was not attempted.  Reflexes: Deep tendon reflexes are symmetric.   Assessment/Plan:   1. Spinocerebellar ataxia   2. Gait disturbance   The patient is having some increasing problems with ataxia, she is having problems using her hands. She is getting through the day fairly well , however. She denies any dysphasia. We will continue to watch her conservatively, we may consider occupational therapy for the hands in the future. She will follow-up in one year, sooner if needed.  Jill Alexanders MD 12/05/2015 9:11 PM  [facility] [address] [place], [place] [address]  Phone [phone] Fax [phone]

## 2015-12-05 NOTE — Patient Instructions (Signed)
Fall Prevention in the Home  Falls can cause injuries and can affect people from all age groups. There are many simple things that you can do to make your home safe and to help prevent falls. WHAT CAN I DO ON THE OUTSIDE OF MY HOME?  Regularly repair the edges of walkways and driveways and fix any cracks.  Remove high doorway thresholds.  Trim any shrubbery on the main path into your home.  Use bright outdoor lighting.  Clear walkways of debris and clutter, including tools and rocks.  Regularly check that handrails are securely fastened and in good repair. Both sides of any steps should have handrails.  Install guardrails along the edges of any raised decks or porches.  Have leaves, snow, and ice cleared regularly.  Use sand or salt on walkways during winter months.  In the garage, clean up any spills right away, including grease or oil spills. WHAT CAN I DO IN THE BATHROOM?  Use night lights.  Install grab bars by the toilet and in the tub and shower. Do not use towel bars as grab bars.  Use non-skid mats or decals on the floor of the tub or shower.  If you need to sit down while you are in the shower, use a plastic, non-slip stool..  Keep the floor dry. Immediately clean up any water that spills on the floor.  Remove soap buildup in the tub or shower on a regular basis.  Attach bath mats securely with double-sided non-slip rug tape.  Remove throw rugs and other tripping hazards from the floor. WHAT CAN I DO IN THE BEDROOM?  Use night lights.  Make sure that a bedside light is easy to reach.  Do not use oversized bedding that drapes onto the floor.  Have a firm chair that has side arms to use for getting dressed.  Remove throw rugs and other tripping hazards from the floor. WHAT CAN I DO IN THE KITCHEN?   Clean up any spills right away.  Avoid walking on wet floors.  Place frequently used items in easy-to-reach places.  If you need to reach for something  above you, use a sturdy step stool that has a grab bar.  Keep electrical cables out of the way.  Do not use floor polish or wax that makes floors slippery. If you have to use wax, make sure that it is non-skid floor wax.  Remove throw rugs and other tripping hazards from the floor. WHAT CAN I DO IN THE STAIRWAYS?  Do not leave any items on the stairs.  Make sure that there are handrails on both sides of the stairs. Fix handrails that are broken or loose. Make sure that handrails are as long as the stairways.  Check any carpeting to make sure that it is firmly attached to the stairs. Fix any carpet that is loose or worn.  Avoid having throw rugs at the top or bottom of stairways, or secure the rugs with carpet tape to prevent them from moving.  Make sure that you have a light switch at the top of the stairs and the bottom of the stairs. If you do not have them, have them installed. WHAT ARE SOME OTHER FALL PREVENTION TIPS?  Wear closed-toe shoes that fit well and support your feet. Wear shoes that have rubber soles or low heels.  When you use a stepladder, make sure that it is completely opened and that the sides are firmly locked. Have someone hold the ladder while you   are using it. Do not climb a closed stepladder.  Add color or contrast paint or tape to grab bars and handrails in your home. Place contrasting color strips on the first and last steps.  Use mobility aids as needed, such as canes, walkers, scooters, and crutches.  Turn on lights if it is dark. Replace any light bulbs that burn out.  Set up furniture so that there are clear paths. Keep the furniture in the same spot.  Fix any uneven floor surfaces.  Choose a carpet design that does not hide the edge of steps of a stairway.  Be aware of any and all pets.  Review your medicines with your healthcare provider. Some medicines can cause dizziness or changes in blood pressure, which increase your risk of falling. Talk  with your health care provider about other ways that you can decrease your risk of falls. This may include working with a physical therapist or trainer to improve your strength, balance, and endurance.   This information is not intended to replace advice given to you by your health care provider. Make sure you discuss any questions you have with your health care provider.   Document Released: 11/08/2002 Document Revised: 04/04/2015 Document Reviewed: 12/23/2014 Elsevier Interactive Patient Education 2016 [employer]  

## 2016-01-16 DIAGNOSIS — C4441 Basal cell carcinoma of skin of scalp and neck: Secondary | ICD-10-CM | POA: Diagnosis not present

## 2016-02-27 DIAGNOSIS — L821 Other seborrheic keratosis: Secondary | ICD-10-CM | POA: Diagnosis not present

## 2016-02-27 DIAGNOSIS — Z85828 Personal history of other malignant neoplasm of skin: Secondary | ICD-10-CM | POA: Diagnosis not present

## 2016-03-09 ENCOUNTER — Telehealth: Payer: Self-pay | Admitting: Family Medicine

## 2016-03-09 DIAGNOSIS — I10 Essential (primary) hypertension: Secondary | ICD-10-CM

## 2016-03-09 DIAGNOSIS — E7800 Pure hypercholesterolemia, unspecified: Secondary | ICD-10-CM

## 2016-03-09 DIAGNOSIS — E559 Vitamin D deficiency, unspecified: Secondary | ICD-10-CM

## 2016-03-09 NOTE — Telephone Encounter (Signed)
-----   Message from Marchia Bond sent at 03/07/2016  4:00 PM EDT ----- Regarding: Cpx labs [facility] 4/13, need orders. Thanks! :-) Please order  future cpx labs for pt's upcoming lab appt. Thanks Aniceto Boss

## 2016-03-14 ENCOUNTER — Other Ambulatory Visit (INDEPENDENT_AMBULATORY_CARE_PROVIDER_SITE_OTHER): Payer: Medicare PPO

## 2016-03-14 DIAGNOSIS — E559 Vitamin D deficiency, unspecified: Secondary | ICD-10-CM | POA: Diagnosis not present

## 2016-03-14 DIAGNOSIS — I10 Essential (primary) hypertension: Secondary | ICD-10-CM | POA: Diagnosis not present

## 2016-03-14 DIAGNOSIS — E7800 Pure hypercholesterolemia, unspecified: Secondary | ICD-10-CM

## 2016-03-14 LAB — COMPREHENSIVE METABOLIC PANEL
ALBUMIN: 4.4 g/dL (ref 3.5–5.2)
ALK PHOS: 77 U/L (ref 39–117)
ALT: 36 U/L — AB (ref 0–35)
AST: 26 U/L (ref 0–37)
BILIRUBIN TOTAL: 0.6 mg/dL (ref 0.2–1.2)
BUN: 16 mg/dL (ref 6–23)
CO2: 32 mEq/L (ref 19–32)
Calcium: 10.3 mg/dL (ref 8.4–10.5)
Chloride: 100 mEq/L (ref 96–112)
Creatinine, Ser: 0.83 mg/dL (ref 0.40–1.20)
GFR: 71.75 mL/min (ref >60.00)
GLUCOSE: 91 mg/dL (ref 70–99)
POTASSIUM: 3.9 meq/L (ref 3.5–5.1)
SODIUM: 139 meq/L (ref 135–145)
TOTAL PROTEIN: 7.7 g/dL (ref 6.0–8.3)

## 2016-03-14 LAB — CBC WITH DIFFERENTIAL/PLATELET
BASOS ABS: 0 10*3/uL (ref 0.0–0.1)
Basophils Relative: 0.5 % (ref 0.0–3.0)
EOS ABS: 0.1 10*3/uL (ref 0.0–0.7)
Eosinophils Relative: 1.5 % (ref 0.0–5.0)
HCT: 44.3 % (ref 36.0–46.0)
Hemoglobin: 14.8 g/dL (ref 12.0–15.0)
LYMPHS ABS: 2.9 10*3/uL (ref 0.7–4.0)
LYMPHS PCT: 30.3 % (ref 12.0–46.0)
MCHC: 33.5 g/dL (ref 30.0–36.0)
MCV: 88.7 fl (ref 78.0–100.0)
MONOS PCT: 7.2 % (ref 3.0–12.0)
Monocytes Absolute: 0.7 10*3/uL (ref 0.1–1.0)
NEUTROS PCT: 60.5 % (ref 43.0–77.0)
Neutro Abs: 5.8 10*3/uL (ref 1.4–7.7)
Platelets: 286 10*3/uL (ref 150.0–400.0)
RBC: 4.99 Mil/uL (ref 3.87–5.11)
RDW: 15.4 % (ref 11.5–15.5)
WBC: 9.6 10*3/uL (ref 4.0–10.5)

## 2016-03-14 LAB — LIPID PANEL
CHOLESTEROL: 251 mg/dL — AB (ref 0–200)
HDL: 56.5 mg/dL (ref >39.00)
LDL Cholesterol: 160 mg/dL — ABNORMAL HIGH (ref 0–99)
NONHDL: 194.54
Total CHOL/HDL Ratio: 4
Triglycerides: 173 mg/dL — ABNORMAL HIGH (ref 0.0–149.0)
VLDL: 34.6 mg/dL (ref 0.0–40.0)

## 2016-03-14 LAB — TSH: TSH: 2.55 u[IU]/mL (ref 0.35–4.50)

## 2016-03-14 LAB — VITAMIN D 25 HYDROXY (VIT D DEFICIENCY, FRACTURES): VITD: 29.8 ng/mL — AB (ref 30.00–100.00)

## 2016-03-19 ENCOUNTER — Encounter: Payer: Self-pay | Admitting: Family Medicine

## 2016-03-19 ENCOUNTER — Ambulatory Visit (INDEPENDENT_AMBULATORY_CARE_PROVIDER_SITE_OTHER): Payer: Medicare PPO | Admitting: Family Medicine

## 2016-03-19 VITALS — BP 125/70 | HR 69 | Temp 98.6°F | Ht 64.0 in | Wt 181.0 lb

## 2016-03-19 DIAGNOSIS — E7800 Pure hypercholesterolemia, unspecified: Secondary | ICD-10-CM

## 2016-03-19 DIAGNOSIS — Z0000 Encounter for general adult medical examination without abnormal findings: Secondary | ICD-10-CM | POA: Diagnosis not present

## 2016-03-19 DIAGNOSIS — E559 Vitamin D deficiency, unspecified: Secondary | ICD-10-CM

## 2016-03-19 DIAGNOSIS — I10 Essential (primary) hypertension: Secondary | ICD-10-CM | POA: Diagnosis not present

## 2016-03-19 DIAGNOSIS — Z1211 Encounter for screening for malignant neoplasm of colon: Secondary | ICD-10-CM

## 2016-03-19 DIAGNOSIS — H6120 Impacted cerumen, unspecified ear: Secondary | ICD-10-CM | POA: Insufficient documentation

## 2016-03-19 DIAGNOSIS — H6123 Impacted cerumen, bilateral: Secondary | ICD-10-CM

## 2016-03-19 MED ORDER — HYDROCHLOROTHIAZIDE 25 MG PO TABS: 25.0000 mg | ORAL_TABLET | Freq: Every day | ORAL | Status: DC

## 2016-03-19 MED ORDER — METOPROLOL SUCCINATE ER 50 MG PO TB24: ORAL_TABLET | ORAL | Status: DC

## 2016-03-19 NOTE — Assessment & Plan Note (Signed)
Reviewed health habits including diet and exercise and skin cancer prevention Reviewed appropriate screening tests for age  Also reviewed health mt list, fam hx and immunization status , as well as social and family history   See HPI Labs reviewed  Increase your vitamin D over the counter to 2000-4000 iu daily  Reduce sugar and fat in diet and work on weight loss Stay as active as you can safely For your cold symptoms - mucinex can help cough and congestion as needed

## 2016-03-19 NOTE — Assessment & Plan Note (Signed)
bp in fair control at this time  BP Readings from Last 1 Encounters:  03/19/16 125/70   No changes needed Disc lifstyle change with low sodium diet and exercise  Wt loss enc

## 2016-03-19 NOTE — Progress Notes (Signed)
Subjective:    Patient ID: Jaime Mccarthy, female    DOB: [birth year], 75 y.o.   MRN: [id number]  HPI Here for annual medicare wellness visit as well as chronic/acute medical problems and annual preventative examination   I have personally reviewed the Medicare Annual Wellness questionnaire and have noted 1. The patient's medical and social history 2. Their use of alcohol, tobacco or illicit drugs 3. Their current medications and supplements 4. The patient's functional ability including ADL's, fall risks, home safety risks and hearing or visual             impairment. 5. Diet and physical activities 6. Evidence for depression or mood disorders  The patients weight, height, BMI have been recorded in the chart and visual acuity is per eye clinic.  I have made referrals, counseling and provided education to the patient based review of the above and I have provided the pt with a written personalized care plan for preventive services. Reviewed and updated provider list, see scanned forms.  Failed hearing test on R (feels plugged up)  See scanned forms.  Routine anticipatory guidance given to patient.  See health maintenance. Colon cancer screening declines further colonoscopies (ifob 3/15 was normal)- declines cologuard due to inability to use hands well enough to get a stool sample  Breast cancer screening 12/16 nl  Self breast exam= no lumps or changes  Flu vaccine 10/16 Tetanus vaccine 4/16  Pneumovax complete on both  Zoster vaccine 10/15  dexa 1/03 was normal and then another in 2010 (per pt normal) - declines another one  Had falls but no fx  Vit D level is still 29 -takes 1000 iu daily / will inc that  Advance directive- she has a living will and POA Cognitive function addressed- see scanned forms- and if abnormal then additional documentation follows. Overall no problems with her memory  PMH and SH reviewed  Meds, vitals, and allergies reviewed.   ROS: See HPI.  Otherwise  negative.    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 suggested OT for hands in the future-can no longer write  Has had a few falls without injury - uses wheeled walker with seat at all times  Wheelchair for longer distances  Hard to exercise  She uses exercise bike for 5-10 minutes at a time   Cholesterol Diet controlled Lab Results  Component Value Date   CHOL 251* 03/14/2016   CHOL 263* 03/03/2015   CHOL 250* 11/18/2013   Lab Results  Component Value Date   HDL 56.50 03/14/2016   HDL 62.00 03/03/2015   HDL 56.60 11/18/2013   Lab Results  Component Value Date   LDLCALC 160* 03/14/2016   LDLCALC 175* 03/03/2015   LDLCALC 153* 10/04/2008   Lab Results  Component Value Date   TRIG 173.0* 03/14/2016   TRIG 132.0 03/03/2015   TRIG 123.0 11/18/2013   Lab Results  Component Value Date   CHOLHDL 4 03/14/2016   CHOLHDL 4 03/03/2015   CHOLHDL 4 11/18/2013   Lab Results  Component Value Date   LDLDIRECT 186.5 11/18/2013   LDLDIRECT 147.0 09/25/2012   LDLDIRECT 133.8 03/03/2012  does not think her diet is "too good" She does not watch anything  Cannot take chol med due to her neurologic dz  She would like to loose wt    bp is stable today (up on first check) No cp  or palpitations or headaches or edema  No side effects to medicines  BP Readings from Last 3 Encounters:  03/19/16 140/78  12/05/15 163/79  03/13/15 138/88     Results for orders placed or performed in visit on 03/14/16  CBC with Differential/Platelet  Result Value Ref Range   WBC 9.6 4.0 - 10.5 K/uL   RBC 4.99 3.87 - 5.11 Mil/uL   Hemoglobin 14.8 12.0 - 15.0 g/dL   HCT 44.3 36.0 - 46.0 %   MCV 88.7 78.0 - 100.0 fl   MCHC 33.5 30.0 - 36.0 g/dL   RDW 15.4 11.5 - 15.5 %   Platelets 286.0 150.0 - 400.0 K/uL   Neutrophils Relative % 60.5 43.0 - 77.0 %   Lymphocytes Relative 30.3 12.0 - 46.0 %   Monocytes Relative 7.2 3.0 -  12.0 %   Eosinophils Relative 1.5 0.0 - 5.0 %   Basophils Relative 0.5 0.0 - 3.0 %   Neutro Abs 5.8 1.4 - 7.7 K/uL   Lymphs Abs 2.9 0.7 - 4.0 K/uL   Monocytes Absolute 0.7 0.1 - 1.0 K/uL   Eosinophils Absolute 0.1 0.0 - 0.7 K/uL   Basophils Absolute 0.0 0.0 - 0.1 K/uL  Comprehensive metabolic panel  Result Value Ref Range   Sodium 139 135 - 145 mEq/L   Potassium 3.9 3.5 - 5.1 mEq/L   Chloride 100 96 - 112 mEq/L   CO2 32 19 - 32 mEq/L   Glucose, Bld 91 70 - 99 mg/dL   BUN 16 6 - 23 mg/dL   Creatinine, Ser 0.83 0.40 - 1.20 mg/dL   Total Bilirubin 0.6 0.2 - 1.2 mg/dL   Alkaline Phosphatase 77 39 - 117 U/L   AST 26 0 - 37 U/L   ALT 36 (H) 0 - 35 U/L   Total Protein 7.7 6.0 - 8.3 g/dL   Albumin 4.4 3.5 - 5.2 g/dL   Calcium 10.3 8.4 - 10.5 mg/dL   GFR 71.75 >60.00 mL/min  Lipid panel  Result Value Ref Range   Cholesterol 251 (H) 0 - 200 mg/dL   Triglycerides 173.0 (H) 0.0 - 149.0 mg/dL   HDL 56.50 >39.00 mg/dL   VLDL 34.6 0.0 - 40.0 mg/dL   LDL Cholesterol 160 (H) 0 - 99 mg/dL   Total CHOL/HDL Ratio 4    NonHDL 194.54   TSH  Result Value Ref Range   TSH 2.55 0.35 - 4.50 uIU/mL  VITAMIN D 25 Hydroxy (Vit-D Deficiency, Fractures)  Result Value Ref Range   VITD 29.80 (L) 30.00 - 100.00 ng/mL    Patient Active Problem List   Diagnosis Date Noted  . Routine general medical examination at a health care facility 03/19/2016  . Cerumen impaction 03/19/2016  . 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 Encounter for Medicare annual wellness exam 11/26/2013  . Colon cancer screening 11/26/2013  . 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 Left knee pain 05/18/2013  . Abnormality of gait 01/26/2013  . Vitamin D deficiency 01/04/2009  . ATAXIA, CEREBELLAR NEC 05/07/2007  . DISORDER, 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  . FIBROIDS, UTERUS 03/16/2007  . HYPERCHOLESTEROLEMIA 03/16/2007  . Essential hypertension 03/16/2007  . ALLERGIC RHINITIS 03/16/2007  . FIBROCYSTIC BREAST DISEASE 03/16/2007  .  HEART MURMUR, HX OF 03/16/2007   Past Medical History  Diagnosis Date  . Allergy     allergic rhinitis seasonal  . Hypertension   . Hyperlipidemia   . Ataxia     degenerative cerebellar  . Gait disorder   . Dyslipidemia   .  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 Degenerative arthritis      Left knee   Past Surgical History  Procedure Laterality Date  . Breast surgery      left breast needle biopsy negative  . Gallbladder surgery     Social History  Substance Use Topics  . Smoking status: Former Research scientist (life sciences)  . Smokeless tobacco: Never Used  . Alcohol Use: No     Comment: occasional   Family History  Problem Relation Age of Onset  . COPD Mother   . Cancer Mother     breast Ca  . Heart disease Father     CAD  . Cancer Father     lung CA  . Depression Daughter   . Heart disease Maternal Grandmother     heart failure   No Known Allergies Current Outpatient Prescriptions on File Prior to Visit  Medication Sig Dispense Refill  . aspirin 325 MG tablet Take 325 mg by mouth daily.    . cholecalciferol (VITAMIN D) 1000 units tablet Take 1,000 Units by mouth daily.    . Flaxseed, Linseed, (FLAX SEED OIL) 1000 MG CAPS Take 2 capsules by mouth daily.      Marland Kitchen ibuprofen (ADVIL,MOTRIN) 600 MG tablet     . MULTIPLE VITAMIN PO Take by mouth daily.     No current facility-administered medications on file prior to visit.    Review of Systems Review of Systems  Constitutional: Negative for fever, appetite change, fatigue and unexpected weight change.  Eyes: Negative for pain and visual disturbance.  Respiratory: Negative for cough and shortness of breath.   Cardiovascular: Negative for cp or palpitations    Gastrointestinal: Negative for nausea, diarrhea and constipation.  Genitourinary: Negative for urgency and frequency.  Skin: Negative for pallor or rash   Neurological: Negative for , light-headedness, numbness and headaches. Pos for progressive ataxia and weakness and speech problems    Hematological: Negative for adenopathy. Does not bruise/bleed easily.  Psychiatric/Behavioral: Negative for dysphoric mood. The patient is not nervous/anxious.         Objective:   Physical Exam  Constitutional: She appears well-developed and well-nourished. No distress.  obese and well appearing   HENT:  Head: Normocephalic and atraumatic.  Nose: Nose normal.  Mouth/Throat: Oropharynx is clear and moist.  Bilateral cerumen impaction   Eyes: Conjunctivae and EOM are normal. Pupils are equal, round, and reactive to light. Right eye exhibits no discharge. Left eye exhibits no discharge. No scleral icterus.  Neck: Normal range of motion. Neck supple. No JVD present. Carotid bruit is not present. No thyromegaly present.  Cardiovascular: Normal rate, regular rhythm, normal heart sounds and intact distal pulses.  Exam reveals no gallop.   Pulmonary/Chest: Effort normal and breath sounds normal. No respiratory distress. She has no wheezes. She has no rales.  Abdominal: Soft. Bowel sounds are normal. She exhibits no distension and no mass. There is no tenderness.  Genitourinary:  Declines breast exam  Musculoskeletal: She exhibits no edema or tenderness.  Lymphadenopathy:    She has no cervical adenopathy.  Neurological: She is alert. She has normal reflexes. No cranial nerve deficit. She exhibits normal muscle tone. Coordination abnormal.  Baseline ataxia (using walker) and slurred speech Did exam sitting in the chair  Skin: Skin is warm and dry. No rash noted. No erythema. No pallor.  Psychiatric: She has a normal mood and affect.          Assessment & Plan:   Problem List Items Addressed  This Visit      Cardiovascular and Mediastinum   Essential hypertension - Primary    bp in fair control at this time  BP Readings from Last 1 Encounters:  03/19/16 125/70   No changes needed Disc lifstyle change with low sodium diet and exercise  Wt loss enc      Relevant Medications    hydrochlorothiazide (HYDRODIURIL) 25 MG tablet   metoprolol succinate (TOPROL-XL) 50 MG 24 hr tablet     Nervous and Auditory   Cerumen impaction    bilat-worse on R causing reduced hearing  Will try debrox otc as directed and f/u if not resolved in 2-3 wk for ear irrigation        Other   Colon cancer screening    Pt declines colon screening  She is not open to colonoscopy Cannot get a stool sample due to her declining neuro fxn for ifob or colorguard      Encounter for Medicare annual wellness exam    Reviewed health habits including diet and exercise and skin cancer prevention Reviewed appropriate screening tests for age  Also reviewed health mt list, fam hx and immunization status , as well as social and family history   See HPI Labs reviewed  Increase your vitamin D over the counter to 2000-4000 iu daily  Reduce sugar and fat in diet and work on weight loss Stay as active as you can safely For your cold symptoms - mucinex can help cough and congestion as needed       HYPERCHOLESTEROLEMIA    Disc goals for lipids and reasons to control them Rev labs with pt Rev low sat fat diet in detail  Pt cannot take statins in light of her current neuro status  Will work harder on low sat fat diet for elevated LDL  Handout given      Relevant Medications   hydrochlorothiazide (HYDRODIURIL) 25 MG tablet   metoprolol succinate (TOPROL-XL) 50 MG 24 hr tablet   Routine general medical examination at a health care facility    Reviewed health habits including diet and exercise and skin cancer prevention Reviewed appropriate screening tests for age  Also reviewed health mt list, fam hx and immunization status , as well as social and family history   See HPI Labs reviewed  Increase your vitamin D over the counter to 2000-4000 iu daily  Reduce sugar and fat in diet and work on weight loss Stay as active as you can safely For your cold symptoms - mucinex can help cough and congestion  as needed       Vitamin D deficiency    Level of 29 Disc imp to bone and overall health  Will inc her otc supplementation to 2000-4000 iu daily  Continue to follow

## 2016-03-19 NOTE — Patient Instructions (Addendum)
For wax in ears- get a product called Debrox over the counter and use it weekly  If no improvement in 2-3 weeks -make an appointment for [place] to flush out your ears  Increase your vitamin D over the counter to 2000-4000 iu daily  Reduce sugar and fat in diet and work on weight loss Stay as active as you can safely For your cold symptoms - mucinex can help cough and congestion as needed

## 2016-03-19 NOTE — Progress Notes (Signed)
Pre visit review using our clinic review tool, if applicable. No additional management support is needed unless otherwise documented below in the visit note. 

## 2016-03-19 NOTE — Assessment & Plan Note (Signed)
bilat-worse on R causing reduced hearing  Will try debrox otc as directed and f/u if not resolved in 2-3 wk for ear irrigation

## 2016-03-19 NOTE — Assessment & Plan Note (Signed)
Pt declines colon screening  She is not open to colonoscopy Cannot get a stool sample due to her declining neuro fxn for ifob or colorguard

## 2016-03-19 NOTE — Assessment & Plan Note (Signed)
Disc goals for lipids and reasons to control them Rev labs with pt Rev low sat fat diet in detail  Pt cannot take statins in light of her current neuro status  Will work harder on low sat fat diet for elevated LDL  Handout given

## 2016-03-19 NOTE — Assessment & Plan Note (Signed)
Level of 29 Disc imp to bone and overall health  Will inc her otc supplementation to 2000-4000 iu daily  Continue to follow

## 2016-07-16 DIAGNOSIS — H25813 Combined forms of age-related cataract, bilateral: Secondary | ICD-10-CM | POA: Diagnosis not present

## 2016-07-16 DIAGNOSIS — H02052 Trichiasis without entropian right lower eyelid: Secondary | ICD-10-CM | POA: Diagnosis not present

## 2016-10-02 DIAGNOSIS — L821 Other seborrheic keratosis: Secondary | ICD-10-CM | POA: Diagnosis not present

## 2016-10-02 DIAGNOSIS — D225 Melanocytic nevi of trunk: Secondary | ICD-10-CM | POA: Diagnosis not present

## 2016-10-02 DIAGNOSIS — D1801 Hemangioma of skin and subcutaneous tissue: Secondary | ICD-10-CM | POA: Diagnosis not present

## 2016-10-02 DIAGNOSIS — Z85820 Personal history of malignant melanoma of skin: Secondary | ICD-10-CM | POA: Diagnosis not present

## 2016-12-04 ENCOUNTER — Ambulatory Visit (INDEPENDENT_AMBULATORY_CARE_PROVIDER_SITE_OTHER): Payer: Medicare PPO | Admitting: Neurology

## 2016-12-04 ENCOUNTER — Encounter: Payer: Self-pay | Admitting: Neurology

## 2016-12-04 VITALS — BP 143/82 | HR 83 | Resp 20 | Ht 64.0 in | Wt 177.0 lb

## 2016-12-04 DIAGNOSIS — R269 Unspecified abnormalities of gait and mobility: Secondary | ICD-10-CM | POA: Diagnosis not present

## 2016-12-04 DIAGNOSIS — G118 Other hereditary ataxias: Secondary | ICD-10-CM

## 2016-12-04 NOTE — Progress Notes (Signed)
Reason for visit: Spinocerebellar ataxia  Jaime Mccarthy is an 76 y.o. female  History of present illness:  Ms. Jaime Mccarthy is a 76 year old right-handed white female with a history of spinocerebellar ataxia. The patient has had a progressive slow alteration in her ability to ambulate and to use her hands. The patient is still walking with a walker, she will have 6-10 falls a year, so far she has not sustained any significant injury. The patient has a lot of difficulty getting up when she does fall, she lives with her husband and he is helpful in this regard. The patient will use a wheelchair for long-distance travel outside the house. The patient has not had troubles falling backwards. She will fall with a walker. She denies any numbness in the extremities. She has some ataxia of speech, she denies any difficulty with swallowing. She may have difficulty feeding herself, however. She returns to this office for an evaluation.   Past Medical History:  Diagnosis Date  . Allergy    allergic rhinitis seasonal  . Ataxia    degenerative cerebellar  . Degenerative arthritis     Left knee  . Dyslipidemia   . Gait disorder   . Hyperlipidemia   . Hypertension   . 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 BREAST SURGERY     left breast needle biopsy negative  . GALLBLADDER SURGERY      Family History  Problem Relation Age of Onset  . COPD Mother   . Cancer Mother     breast Ca  . Heart disease Father     CAD  . Cancer Father     lung CA  . Depression Daughter   . Heart disease Maternal Grandmother     heart failure    Social history:  reports that she has quit smoking. She has never used smokeless tobacco. She reports that she does not drink alcohol or use drugs.   No Known Allergies  Medications:  Prior to Admission medications   Medication Sig Start Date End Date Taking? Authorizing Provider  aspirin 325 MG tablet Take 325 mg by mouth  daily.   Yes Historical Provider, MD  cholecalciferol (VITAMIN D) 1000 units tablet Take 1,000 Units by mouth daily.   Yes Historical Provider, MD  Flaxseed, Linseed, (FLAX SEED OIL) 1000 MG CAPS Take 2 capsules by mouth daily.     Yes Historical Provider, MD  hydrochlorothiazide (HYDRODIURIL) 25 MG tablet Take 1 tablet (25 mg total) by mouth daily. 03/19/16  Yes Abner Greenspan, MD  ibuprofen (ADVIL,MOTRIN) 600 MG tablet  02/06/15  Yes Historical Provider, MD  metoprolol succinate (TOPROL-XL) 50 MG 24 hr tablet Take 1/2 pill by mouth once daily 03/19/16  Yes Abner Greenspan, MD  MULTIPLE VITAMIN PO Take by mouth daily.   Yes Historical Provider, MD    ROS:  Out of a complete 14 system review of symptoms, the patient complains only of the following symptoms, and all other reviewed systems are negative.  Gait disturbance Speech difficulty, weakness Agitation  Blood pressure (!) 143/82, pulse 83, resp. rate 20, height 5\' 4"  (1.626 m), weight 177 lb (80.3 kg).  Physical Exam  General: The patient is alert and cooperative at the time of the examination.  Skin: No significant peripheral edema is noted.   Neurologic Exam  Mental status: The patient is alert and oriented x 3 at the time of the examination. The patient  has apparent normal recent and remote memory, with an apparently normal attention span and concentration ability.   Cranial nerves: Facial symmetry is present. Speech is ataxic, not aphasic. Extraocular movements are full. Visual fields are full. Nystagmus is seen on end gaze, and with superior gaze.  Motor: The patient has good strength in all 4 extremities.  Sensory examination: Soft touch sensation is symmetric on the face, arms, and legs.  Coordination: The patient has dysmetria with finger-nose-finger and heel-to-shin bilaterally.  Gait and station: The patient has a slightly wide-based, ataxic gait, the patient walks with knees locked, she walks with a walker. Tandem gait  was not attempted. Romberg is negative. No drift is seen.  Reflexes: Deep tendon reflexes are symmetric.   Assessment/Plan:  1. Spinocerebellar ataxia  The patient has a significant gait disorder associated with her progressive disorder. The patient is falling with some regularity, in the future she may require a motorized wheelchair or scooter to mobilize inside and outside the house. She will follow-up in one year.  Jill Alexanders MD 12/04/2016 10:08 AM  [facility] [address] [place], [place] [address]  Phone [phone] Fax [phone]

## 2016-12-04 NOTE — Patient Instructions (Addendum)
Fall Prevention in the Home Falls can cause injuries and can affect people from all age groups. There are many simple things that you can do to make your home safe and to help prevent falls. What can I do on the outside of my home?  Regularly repair the edges of walkways and driveways and fix any cracks.  Remove high doorway thresholds.  Trim any shrubbery on the main path into your home.  Use bright outdoor lighting.  Clear walkways of debris and clutter, including tools and rocks.  Regularly check that handrails are securely fastened and in good repair. Both sides of any steps should have handrails.  Install guardrails along the edges of any raised decks or porches.  Have leaves, snow, and ice cleared regularly.  Use sand or salt on walkways during winter months.  In the garage, clean up any spills right away, including grease or oil spills. What can I do in the bathroom?  Use night lights.  Install grab bars by the toilet and in the tub and shower. Do not use towel bars as grab bars.  Use non-skid mats or decals on the floor of the tub or shower.  If you need to sit down while you are in the shower, use a plastic, non-slip stool.  Keep the floor dry. Immediately clean up any water that spills on the floor.  Remove soap buildup in the tub or shower on a regular basis.  Attach bath mats securely with double-sided non-slip rug tape.  Remove throw rugs and other tripping hazards from the floor. What can I do in the bedroom?  Use night lights.  Make sure that a bedside light is easy to reach.  Do not use oversized bedding that drapes onto the floor.  Have a firm chair that has side arms to use for getting dressed.  Remove throw rugs and other tripping hazards from the floor. What can I do in the kitchen?  Clean up any spills right away.  Avoid walking on wet floors.  Place frequently used items in easy-to-reach places.  If you need to reach for something above  you, use a sturdy step stool that has a grab bar.  Keep electrical cables out of the way.  Do not use floor polish or wax that makes floors slippery. If you have to use wax, make sure that it is non-skid floor wax.  Remove throw rugs and other tripping hazards from the floor. What can I do in the stairways?  Do not leave any items on the stairs.  Make sure that there are handrails on both sides of the stairs. Fix handrails that are broken or loose. Make sure that handrails are as long as the stairways.  Check any carpeting to make sure that it is firmly attached to the stairs. Fix any carpet that is loose or worn.  Avoid having throw rugs at the top or bottom of stairways, or secure the rugs with carpet tape to prevent them from moving.  Make sure that you have a light switch at the top of the stairs and the bottom of the stairs. If you do not have them, have them installed. What are some other fall prevention tips?  Wear closed-toe shoes that fit well and support your feet. Wear shoes that have rubber soles or low heels.  When you use a stepladder, make sure that it is completely opened and that the sides are firmly locked. Have someone hold the ladder while you are using   it. Do not climb a closed stepladder.  Add color or contrast paint or tape to grab bars and handrails in your home. Place contrasting color strips on the first and last steps.  Use mobility aids as needed, such as canes, walkers, scooters, and crutches.  Turn on lights if it is dark. Replace any light bulbs that burn out.  Set up furniture so that there are clear paths. Keep the furniture in the same spot.  Fix any uneven floor surfaces.  Choose a carpet design that does not hide the edge of steps of a stairway.  Be aware of any and all pets.  Review your medicines with your healthcare provider. Some medicines can cause dizziness or changes in blood pressure, which increase your risk of falling. Talk with  your health care provider about other ways that you can decrease your risk of falls. This may include working with a physical therapist or trainer to improve your strength, balance, and endurance. This information is not intended to replace advice given to you by your health care provider. Make sure you discuss any questions you have with your health care provider. Document Released: 11/08/2002 Document Revised: 04/16/2016 Document Reviewed: 12/23/2014 Elsevier Interactive Patient Education  2017 [employer]  

## 2016-12-09 ENCOUNTER — Encounter: Payer: Self-pay | Admitting: Family Medicine

## 2016-12-09 DIAGNOSIS — Z1231 Encounter for screening mammogram for malignant neoplasm of breast: Secondary | ICD-10-CM | POA: Diagnosis not present

## 2016-12-25 ENCOUNTER — Telehealth: Payer: Self-pay | Admitting: Neurology

## 2016-12-25 DIAGNOSIS — R269 Unspecified abnormalities of gait and mobility: Secondary | ICD-10-CM

## 2016-12-25 NOTE — Telephone Encounter (Signed)
I called the patient, talk with her husband. They will need a new walker, I will write a prescription, they wish me to fax this into [facility]  The telephone number is [phone], fax number is [phone]. I will fax the prescription in.

## 2016-12-30 ENCOUNTER — Encounter: Payer: Self-pay | Admitting: Neurology

## 2016-12-30 ENCOUNTER — Telehealth: Payer: Self-pay | Admitting: Neurology

## 2016-12-30 NOTE — Telephone Encounter (Signed)
I called patient. I talk with her husband. The [employer] company will need a letter of irritation indicating that the patient needs the walker for ambulation, I will dictate a brief letter and send this in.

## 2017-01-06 DIAGNOSIS — M1990 Unspecified osteoarthritis, unspecified site: Secondary | ICD-10-CM | POA: Diagnosis not present

## 2017-03-05 ENCOUNTER — Telehealth: Payer: Self-pay | Admitting: Family Medicine

## 2017-03-05 DIAGNOSIS — E559 Vitamin D deficiency, unspecified: Secondary | ICD-10-CM

## 2017-03-05 DIAGNOSIS — Z0000 Encounter for general adult medical examination without abnormal findings: Secondary | ICD-10-CM

## 2017-03-05 NOTE — Telephone Encounter (Signed)
-----   Message from Ellamae Sia sent at 03/05/2017  2:38 PM EDT ----- Regarding: Lab orders for Thursday, 4.19.18 Patient is scheduled for CPX labs, please order future labs, Thanks , Karna Christmas

## 2017-03-18 ENCOUNTER — Encounter: Payer: Self-pay | Admitting: Family Medicine

## 2017-03-20 ENCOUNTER — Ambulatory Visit (INDEPENDENT_AMBULATORY_CARE_PROVIDER_SITE_OTHER): Payer: Medicare PPO

## 2017-03-20 ENCOUNTER — Other Ambulatory Visit: Payer: Medicare PPO

## 2017-03-20 VITALS — BP 130/74 | HR 79 | Temp 98.4°F | Ht 63.5 in | Wt 179.5 lb

## 2017-03-20 DIAGNOSIS — E559 Vitamin D deficiency, unspecified: Secondary | ICD-10-CM | POA: Diagnosis not present

## 2017-03-20 DIAGNOSIS — Z0000 Encounter for general adult medical examination without abnormal findings: Secondary | ICD-10-CM

## 2017-03-20 LAB — CBC WITH DIFFERENTIAL/PLATELET
BASOS ABS: 0 10*3/uL (ref 0.0–0.1)
BASOS PCT: 0.4 % (ref 0.0–3.0)
EOS ABS: 0.1 10*3/uL (ref 0.0–0.7)
Eosinophils Relative: 1 % (ref 0.0–5.0)
HEMATOCRIT: 44 % (ref 36.0–46.0)
Hemoglobin: 14.6 g/dL (ref 12.0–15.0)
LYMPHS PCT: 31 % (ref 12.0–46.0)
Lymphs Abs: 2.7 10*3/uL (ref 0.7–4.0)
MCHC: 33.1 g/dL (ref 30.0–36.0)
MCV: 90.5 fl (ref 78.0–100.0)
Monocytes Absolute: 0.7 10*3/uL (ref 0.1–1.0)
Monocytes Relative: 8 % (ref 3.0–12.0)
Neutro Abs: 5.2 10*3/uL (ref 1.4–7.7)
Neutrophils Relative %: 59.6 % (ref 43.0–77.0)
Platelets: 311 10*3/uL (ref 150.0–400.0)
RBC: 4.87 Mil/uL (ref 3.87–5.11)
RDW: 15.5 % (ref 11.5–15.5)
WBC: 8.7 10*3/uL (ref 4.0–10.5)

## 2017-03-20 LAB — LIPID PANEL
CHOL/HDL RATIO: 4
Cholesterol: 266 mg/dL — ABNORMAL HIGH (ref 0–200)
HDL: 60.2 mg/dL (ref >39.00)
LDL CALC: 177 mg/dL — AB (ref 0–99)
NonHDL: 206.11
TRIGLYCERIDES: 144 mg/dL (ref 0.0–149.0)
VLDL: 28.8 mg/dL (ref 0.0–40.0)

## 2017-03-20 LAB — COMPREHENSIVE METABOLIC PANEL
ALBUMIN: 4.5 g/dL (ref 3.5–5.2)
ALT: 35 U/L (ref 0–35)
AST: 31 U/L (ref 0–37)
Alkaline Phosphatase: 78 U/L (ref 39–117)
BUN: 12 mg/dL (ref 6–23)
CALCIUM: 10 mg/dL (ref 8.4–10.5)
CHLORIDE: 101 meq/L (ref 96–112)
CO2: 30 mEq/L (ref 19–32)
CREATININE: 0.75 mg/dL (ref 0.40–1.20)
GFR: 80.43 mL/min (ref >60.00)
Glucose, Bld: 88 mg/dL (ref 70–99)
Potassium: 3.7 mEq/L (ref 3.5–5.1)
Sodium: 140 mEq/L (ref 135–145)
Total Bilirubin: 0.5 mg/dL (ref 0.2–1.2)
Total Protein: 7.9 g/dL (ref 6.0–8.3)

## 2017-03-20 LAB — TSH: TSH: 3.06 u[IU]/mL (ref 0.35–4.50)

## 2017-03-20 LAB — VITAMIN D 25 HYDROXY (VIT D DEFICIENCY, FRACTURES): VITD: 44.04 ng/mL (ref 30.00–100.00)

## 2017-03-20 NOTE — Patient Instructions (Signed)
Jaime Mccarthy , Thank you for taking time to come for your Medicare Wellness Visit. I appreciate your ongoing commitment to your health goals. Please review the following plan we discussed and let me know if I can assist you in the future.   These are the goals we discussed: Goals    . health management          Starting 03/20/17, I will continue to take medications as prescribed and to maintain a positive outlook on life.        This is a list of the screening recommended for you and due dates:  Health Maintenance  Topic Date Due  . Colon Cancer Screening  01/03/2021*  . Flu Shot  07/02/2017  . Mammogram  12/09/2017  . Tetanus Vaccine  03/12/2025  . DEXA scan (bone density measurement)  Addressed  . Pneumonia vaccines  Completed  *Topic was postponed. The date shown is not the original due date.   Preventive Care for Adults  A healthy lifestyle and preventive care can promote health and wellness. Preventive health guidelines for adults include the following key practices.  . A routine yearly physical is a good way to check with your health care provider about your health and preventive screening. It is a chance to share any concerns and updates on your health and to receive a thorough exam.  . Visit your dentist for a routine exam and preventive care every 6 months. Brush your teeth twice a day and floss once a day. Good oral hygiene prevents tooth decay and gum disease.  . The frequency of eye exams is based on your age, health, family medical history, use  of contact lenses, and other factors. Follow your health care provider's ecommendations for frequency of eye exams.  . Eat a healthy diet. Foods like vegetables, fruits, whole grains, low-fat dairy products, and lean protein foods contain the nutrients you need without too many calories. Decrease your intake of foods high in solid fats, added sugars, and salt. Eat the right amount of calories for you. Get information about a proper  diet from your health care provider, if necessary.  . Regular physical exercise is one of the most important things you can do for your health. Most adults should get at least 150 minutes of moderate-intensity exercise (any activity that increases your heart rate and causes you to sweat) each week. In addition, most adults need muscle-strengthening exercises on 2 or more days a week.  Silver Sneakers may be a benefit available to you. To determine eligibility, you may visit the website: www.silversneakers.com or contact program at [phone])735-9438 Mon-Fri between 8AM-8PM.   . Maintain a healthy weight. The body mass index (BMI) is a screening tool to identify possible weight problems. It provides an estimate of body fat based on height and weight. Your health care provider can find your BMI and can help you achieve or maintain a healthy weight.   For adults 20 years and older: ? A BMI below 18.5 is considered underweight. ? A BMI of 18.5 to 24.9 is normal. ? A BMI of 25 to 29.9 is considered overweight. ? A BMI of 30 and above is considered obese.   . Maintain normal blood lipids and cholesterol levels by exercising and minimizing your intake of saturated fat. Eat a balanced diet with plenty of fruit and vegetables. Blood tests for lipids and cholesterol should begin at age 44 and be repeated every 5 years. If your lipid or cholesterol levels are  high, you are over 50, or you are at high risk for heart disease, you may need your cholesterol levels checked more frequently. Ongoing high lipid and cholesterol levels should be treated with medicines if diet and exercise are not working.  . If you smoke, find out from your health care provider how to quit. If you do not use tobacco, please do not start.  . If you choose to drink alcohol, please do not consume more than 2 drinks per day. One drink is considered to be 12 ounces (355 mL) of beer, 5 ounces (148 mL) of wine, or 1.5 ounces (44 mL) of  liquor.  . If you are 70-72 years old, ask your health care provider if you should take aspirin to prevent strokes.  . Use sunscreen. Apply sunscreen liberally and repeatedly throughout the day. You should seek shade when your shadow is shorter than you. Protect yourself by wearing long sleeves, pants, a wide-brimmed hat, and sunglasses year round, whenever you are outdoors.  . Once a month, do a whole body skin exam, using a mirror to look at the skin on your back. Tell your health care provider of new moles, moles that have irregular borders, moles that are larger than a pencil eraser, or moles that have changed in shape or color.

## 2017-03-20 NOTE — Progress Notes (Signed)
Pre visit review using our clinic review tool, if applicable. No additional management support is needed unless otherwise documented below in the visit note. 

## 2017-03-21 NOTE — Progress Notes (Signed)
Subjective:   Jaime Mccarthy is a 76 y.o. female who presents for Medicare Annual (Subsequent) preventive examination.  Review of Systems:  N/A Cardiac Risk Factors include: advanced age (>54men, >21 women);obesity (BMI >30kg/m2);dyslipidemia;hypertension     Objective:     Vitals: BP 130/74 (BP Location: Right Arm, Patient Position: Sitting, Cuff Size: Normal)   Pulse 79   Temp 98.4 F (36.9 C) (Oral)   Ht 5' 3.5" (1.613 m) Comment: shoes  Wt 179 lb 8 oz (81.4 kg)   SpO2 95%   BMI 31.30 kg/m   Body mass index is 31.3 kg/m.   Tobacco History  Smoking Status  . Former Smoker  Smokeless Tobacco  . Never Used     Counseling given: No   Past Medical History:  Diagnosis Date  . Allergy    allergic rhinitis seasonal  . Ataxia    degenerative cerebellar  . Degenerative arthritis     Left knee  . Dyslipidemia   . Gait disorder   . Hyperlipidemia   . Hypertension   . 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 BREAST SURGERY     left breast needle biopsy negative  . GALLBLADDER SURGERY     Family History  Problem Relation Age of Onset  . COPD Mother   . Cancer Mother     breast Ca  . Heart disease Father     CAD  . Cancer Father     lung CA  . Depression Daughter   . Heart disease Maternal Grandmother     heart failure   History  Sexual Activity  . Sexual activity: Not on file    Outpatient Encounter Prescriptions as of 03/20/2017  Medication Sig  . aspirin 325 MG tablet Take 325 mg by mouth daily.  . cholecalciferol (VITAMIN D) 1000 units tablet Take 2,000 Units by mouth daily.   . Flaxseed, Linseed, (FLAX SEED OIL) 1000 MG CAPS Take 2 capsules by mouth daily.    . hydrochlorothiazide (HYDRODIURIL) 25 MG tablet Take 1 tablet (25 mg total) by mouth daily.  Marland Kitchen ibuprofen (ADVIL,MOTRIN) 600 MG tablet   . metoprolol succinate (TOPROL-XL) 50 MG 24 hr tablet Take 1/2 pill by mouth once daily  . MULTIPLE VITAMIN PO  Take by mouth daily.   No facility-administered encounter medications on file as of 03/20/2017.     Activities of Daily Living In your present state of health, do you have any difficulty performing the following activities: 03/20/2017  Hearing? N  Vision? Y  Difficulty concentrating or making decisions? N  Walking or climbing stairs? Y  Dressing or bathing? N  Doing errands, shopping? N  Preparing Food and eating ? Y  Using the Toilet? N  In the past six months, have you accidently leaked urine? Y  Do you have problems with loss of bowel control? Y  Managing your Medications? N  Managing your Finances? Y  Housekeeping or managing your Housekeeping? Y  Some recent data might be hidden    Patient Care Team: Abner Greenspan, MD as PCP - General    Assessment:     Hearing Screening   125Hz  250Hz  500Hz  1000Hz  2000Hz  3000Hz  4000Hz  6000Hz  8000Hz   Right ear:   40 40 40  40    Left ear:   40 40 40  40    Vision Screening Comments: Last vision exam in Oct 2017   Exercise Activities and Dietary recommendations Current Exercise Habits:  The patient does not participate in regular exercise at present, Exercise limited by: None identified  Goals    . health management          Starting 03/20/17, I will continue to take medications as prescribed and to maintain a positive outlook on life.       Fall Risk Fall Risk  03/20/2017 03/19/2016 03/13/2015 11/26/2013  Falls in the past year? Yes Yes Yes Yes  Number falls in past yr: 2 or more 2 or more 2 or more 2 or more  Injury with Fall? No No No -  Risk Factor Category  - High Fall Risk High Fall Risk High Fall Risk  Risk for fall due to : Impaired balance/gait;Impaired mobility - - Impaired balance/gait;Impaired mobility   Depression Screen PHQ 2/9 Scores 03/20/2017 03/19/2016 03/13/2015 11/26/2013  PHQ - 2 Score 0 0 0 0     Cognitive Function MMSE - Mini Mental State Exam 03/20/2017  Orientation to time 5  Orientation to Place 5    Registration 3  Attention/ Calculation 0  Recall 3  Language- name 2 objects 0  Language- repeat 1  Language- follow 3 step command 3  Language- read & follow direction 0  Write a sentence 0  Copy design 0  Total score 20     PLEASE NOTE: A Mini-Cog screen was completed. Maximum score is 20. A value of 0 denotes this part of Folstein MMSE was not completed or the patient failed this part of the Mini-Cog screening.   Mini-Cog Screening Orientation to Time - Max 5 pts Orientation to Place - Max 5 pts Registration - Max 3 pts Recall - Max 3 pts Language Repeat - Max 1 pts Language Follow 3 Step Command - Max 3 pts     Immunization History  Administered Date(s) Administered  . Influenza Split 12/09/2012  . Influenza,inj,Quad PF,36+ Mos 11/26/2013  . Influenza-Unspecified 09/28/2015  . Pneumococcal Conjugate-13 03/13/2015  . Pneumococcal Polysaccharide-23 11/18/2012  . Td 03/21/2005, 03/13/2015  . Zoster 09/05/2014   Screening Tests Health Maintenance  Topic Date Due  . COLONOSCOPY  01/03/2021 (Originally 11/07/2013)  . INFLUENZA VACCINE  07/02/2017  . MAMMOGRAM  12/09/2017  . TETANUS/TDAP  03/12/2025  . DEXA SCAN  Completed  . PNA vac Low Risk Adult  Completed      Plan:     I have personally reviewed and addressed the Medicare Annual Wellness questionnaire and have noted the following in the patient's chart:  A. Medical and social history B. Use of alcohol, tobacco or illicit drugs  C. Current medications and supplements D. Functional ability and status E.  Nutritional status F.  Physical activity G. Advance directives H. List of other physicians I.  Hospitalizations, surgeries, and ER visits in previous 12 months J.  [place] to include hearing, vision, cognitive, depression L. Referrals and appointments - none  In addition, I have reviewed and discussed with patient certain preventive protocols, quality metrics, and best practice  recommendations. A written personalized care plan for preventive services as well as general preventive health recommendations were provided to patient.  See attached scanned questionnaire for additional information.   Signed,   Lindell Noe, MHA, BS, LPN Health Coach

## 2017-03-21 NOTE — Progress Notes (Signed)
PCP notes:   Health maintenance:  No gaps identified.  Abnormal screenings:   Fall risk - hx of multiple falls without injury  Patient concerns:   None  Nurse concerns:  None  Next PCP appt:   03/24/17 @ 0930

## 2017-03-21 NOTE — Progress Notes (Signed)
I reviewed health advisor's note, was available for consultation, and agree with documentation and plan.  

## 2017-03-24 ENCOUNTER — Encounter: Payer: Self-pay | Admitting: Family Medicine

## 2017-03-24 ENCOUNTER — Telehealth: Payer: Self-pay | Admitting: Family Medicine

## 2017-03-24 ENCOUNTER — Ambulatory Visit (INDEPENDENT_AMBULATORY_CARE_PROVIDER_SITE_OTHER): Payer: Medicare PPO | Admitting: Family Medicine

## 2017-03-24 VITALS — BP 138/80 | HR 72 | Temp 98.4°F | Ht 63.5 in | Wt 180.5 lb

## 2017-03-24 DIAGNOSIS — Z0000 Encounter for general adult medical examination without abnormal findings: Secondary | ICD-10-CM | POA: Diagnosis not present

## 2017-03-24 DIAGNOSIS — Z1211 Encounter for screening for malignant neoplasm of colon: Secondary | ICD-10-CM | POA: Diagnosis not present

## 2017-03-24 DIAGNOSIS — E559 Vitamin D deficiency, unspecified: Secondary | ICD-10-CM | POA: Diagnosis not present

## 2017-03-24 DIAGNOSIS — I10 Essential (primary) hypertension: Secondary | ICD-10-CM

## 2017-03-24 DIAGNOSIS — E2839 Other primary ovarian failure: Secondary | ICD-10-CM | POA: Diagnosis not present

## 2017-03-24 DIAGNOSIS — E7800 Pure hypercholesterolemia, unspecified: Secondary | ICD-10-CM

## 2017-03-24 DIAGNOSIS — G118 Other hereditary ataxias: Secondary | ICD-10-CM

## 2017-03-24 MED ORDER — METOPROLOL SUCCINATE ER 50 MG PO TB24: ORAL_TABLET | ORAL | 3 refills | Status: DC

## 2017-03-24 MED ORDER — HYDROCHLOROTHIAZIDE 25 MG PO TABS: 25.0000 mg | ORAL_TABLET | Freq: Every day | ORAL | 3 refills | Status: DC

## 2017-03-24 NOTE — Assessment & Plan Note (Signed)
bp in fair control at this time  BP Readings from Last 1 Encounters:  03/24/17 138/80   No changes needed Disc lifstyle change with low sodium diet and exercise  Better on 2nd check  Labs reviewed  Wt loss enc

## 2017-03-24 NOTE — Progress Notes (Signed)
Subjective:    Patient ID: Jaime Mccarthy, female    DOB: 1940/12/11, 76 y.o.   MRN: [id number]  HPI Here for health maintenance exam and to review chronic medical problems    Feeling great overall  Not a bad year   Wt Readings from Last 3 Encounters:  03/24/17 180 lb 8 oz (81.9 kg)  03/20/17 179 lb 8 oz (81.4 kg)  12/04/16 177 lb (80.3 kg)  stable/ per pt eating too much and not eating healthy Difficult to exercise  bmi of 31.4  Had amw visit 4/19 Noted several falls w/o injury (she has ataxia baseline) Uses walker at all times  Her living area is adjusted for fall pref Cannot get up by herself  Keeps her cell phone on her at all times and husband is always there   Colon cancer screening 12/04 normal colonoscopy- declines  3/15 ifob neg  Unsure if she has the coordination to do the cologuard test Can no longer do ifob   utd imms   Zoster imm 10/15 May be interested in the new shingles inactivated vaccine   Mammogram 1/18 neg Self breast exam- no lumps or changes  Mother had breast cancer   dexa 1/10 (per pt) -did it [facility] - traveling [facility]  Before that -did it at [facility]  Has had falls and no fractures  D level 44   bp is up on first check (pt states it tends to be higher in L arm)  No cp or palpitations or headaches or edema  No side effects to medicines  BP Readings from Last 3 Encounters:  03/24/17 (!) 146/86  03/20/17 130/74  12/04/16 (!) 143/82      Hx of spinocerebellar ataxia  Sees neuro regularly  Speech is still very slow  Walking is getting worse gradually  Has a wheelchair prn and working on a new transport chair and walker  Frustrating    Hx of hyperlipidemia Lab Results  Component Value Date   CHOL 266 (H) 03/20/2017   CHOL 251 (H) 03/14/2016   CHOL 263 (H) 03/03/2015   Lab Results  Component Value Date   HDL 60.20 03/20/2017   HDL 56.50 03/14/2016   HDL 62.00 03/03/2015   Lab Results  Component Value Date   LDLCALC  177 (H) 03/20/2017   LDLCALC 160 (H) 03/14/2016   LDLCALC 175 (H) 03/03/2015   Lab Results  Component Value Date   TRIG 144.0 03/20/2017   TRIG 173.0 (H) 03/14/2016   TRIG 132.0 03/03/2015   Lab Results  Component Value Date   CHOLHDL 4 03/20/2017   CHOLHDL 4 03/14/2016   CHOLHDL 4 03/03/2015   Lab Results  Component Value Date   LDLDIRECT 186.5 11/18/2013   LDLDIRECT 147.0 09/25/2012   LDLDIRECT 133.8 03/03/2012  she cannot take statins in light of her neuro diagnosis  Diet -not good/ she is not motivated to eat low fat diet  She quit putting 1/2 and 1/2 in her coffee Still eats fried foods  She is not interested in PCYK9 drug yet   Results for orders placed or performed in visit on 03/20/17  Comprehensive metabolic panel  Result Value Ref Range   Sodium 140 135 - 145 mEq/L   Potassium 3.7 3.5 - 5.1 mEq/L   Chloride 101 96 - 112 mEq/L   CO2 30 19 - 32 mEq/L   Glucose, Bld 88 70 - 99 mg/dL   BUN 12 6 - 23 mg/dL   Creatinine,  Ser 0.75 0.40 - 1.20 mg/dL   Total Bilirubin 0.5 0.2 - 1.2 mg/dL   Alkaline Phosphatase 78 39 - 117 U/L   AST 31 0 - 37 U/L   ALT 35 0 - 35 U/L   Total Protein 7.9 6.0 - 8.3 g/dL   Albumin 4.5 3.5 - 5.2 g/dL   Calcium 10.0 8.4 - 10.5 mg/dL   GFR 80.43 >60.00 mL/min  CBC with Differential/Platelet  Result Value Ref Range   WBC 8.7 4.0 - 10.5 K/uL   RBC 4.87 3.87 - 5.11 Mil/uL   Hemoglobin 14.6 12.0 - 15.0 g/dL   HCT 44.0 36.0 - 46.0 %   MCV 90.5 78.0 - 100.0 fl   MCHC 33.1 30.0 - 36.0 g/dL   RDW 15.5 11.5 - 15.5 %   Platelets 311.0 150.0 - 400.0 K/uL   Neutrophils Relative % 59.6 43.0 - 77.0 %   Lymphocytes Relative 31.0 12.0 - 46.0 %   Monocytes Relative 8.0 3.0 - 12.0 %   Eosinophils Relative 1.0 0.0 - 5.0 %   Basophils Relative 0.4 0.0 - 3.0 %   Neutro Abs 5.2 1.4 - 7.7 K/uL   Lymphs Abs 2.7 0.7 - 4.0 K/uL   Monocytes Absolute 0.7 0.1 - 1.0 K/uL   Eosinophils Absolute 0.1 0.0 - 0.7 K/uL   Basophils Absolute 0.0 0.0 - 0.1 K/uL    Lipid panel  Result Value Ref Range   Cholesterol 266 (H) 0 - 200 mg/dL   Triglycerides 144.0 0.0 - 149.0 mg/dL   HDL 60.20 >39.00 mg/dL   VLDL 28.8 0.0 - 40.0 mg/dL   LDL Cholesterol 177 (H) 0 - 99 mg/dL   Total CHOL/HDL Ratio 4    NonHDL 206.11   TSH  Result Value Ref Range   TSH 3.06 0.35 - 4.50 uIU/mL  VITAMIN D 25 Hydroxy (Vit-D Deficiency, Fractures)  Result Value Ref Range   VITD 44.04 30.00 - 100.00 ng/mL     Patient Active Problem List   Diagnosis Date Noted  . Estrogen deficiency 03/24/2017  . Routine general medical examination at a health care facility 03/19/2016  . Cerumen impaction 03/19/2016  . 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 Encounter for Medicare annual wellness exam 11/26/2013  . Colon cancer screening 11/26/2013  . 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 Left knee pain 05/18/2013  . Abnormality of gait 01/26/2013  . Vitamin D deficiency 01/04/2009  . DISORDER, 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  . FIBROIDS, UTERUS 03/16/2007  . HYPERCHOLESTEROLEMIA 03/16/2007  . Essential hypertension 03/16/2007  . ALLERGIC RHINITIS 03/16/2007  . FIBROCYSTIC BREAST DISEASE 03/16/2007  . HEART MURMUR, HX OF 03/16/2007   Past Medical History:  Diagnosis Date  . Allergy    allergic rhinitis seasonal  . Ataxia    degenerative cerebellar  . Degenerative arthritis     Left knee  . Dyslipidemia   . Gait disorder   . Hyperlipidemia   . Hypertension   . 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 BREAST SURGERY     left breast needle biopsy negative  . GALLBLADDER SURGERY     Social History  Substance Use Topics  . Smoking status: Former Research scientist (life sciences)  . Smokeless tobacco: Never Used  . Alcohol use No     Comment: occasional   Family History  Problem Relation Age of Onset  . COPD Mother   . Cancer Mother     breast Ca  . Heart disease Father  CAD  . Cancer Father     lung CA  . Depression Daughter   . Heart disease  Maternal Grandmother     heart failure   No Known Allergies Current Outpatient Prescriptions on File Prior to Visit  Medication Sig Dispense Refill  . aspirin 325 MG tablet Take 325 mg by mouth daily.    . cholecalciferol (VITAMIN D) 1000 units tablet Take 2,000 Units by mouth daily.     . Flaxseed, Linseed, (FLAX SEED OIL) 1000 MG CAPS Take 2 capsules by mouth daily.      Marland Kitchen ibuprofen (ADVIL,MOTRIN) 600 MG tablet     . MULTIPLE VITAMIN PO Take by mouth daily.     No current facility-administered medications on file prior to visit.     Review of Systems Review of Systems  Constitutional: Negative for fever, appetite change, fatigue and unexpected weight change.  Eyes: Negative for pain and visual disturbance.  Respiratory: Negative for cough and shortness of breath.   Cardiovascular: Negative for cp or palpitations    Gastrointestinal: Negative for nausea, diarrhea and constipation.  Genitourinary: Negative for urgency and frequency.  Skin: Negative for pallor or rash   Neurological: Negative for weakness, light-headedness, numbness and headaches. pos for ataxia and global coordination problems as well as slurred speech baseline  Hematological: Negative for adenopathy. Does not bruise/bleed easily.  Psychiatric/Behavioral: Negative for dysphoric mood. The patient is occ nervous/anxious.         Objective:   Physical Exam  Constitutional: She appears well-developed and well-nourished. No distress.  Well appearing elderly female -examined in chair due to ataxia  Obese   HENT:  Head: Normocephalic and atraumatic.  Right Ear: External ear normal.  Left Ear: External ear normal.  Mouth/Throat: Oropharynx is clear and moist.  Eyes: Conjunctivae and EOM are normal. Pupils are equal, round, and reactive to light. No scleral icterus.  Neck: Normal range of motion. Neck supple. No JVD present. Carotid bruit is not present. No thyromegaly present.  Cardiovascular: Normal rate, regular  rhythm and intact distal pulses.  Exam reveals no gallop.   Murmur heard. Pulmonary/Chest: Effort normal and breath sounds normal. No respiratory distress. She has no wheezes. She exhibits no tenderness.  Abdominal: Soft. Bowel sounds are normal. She exhibits no distension, no abdominal bruit and no mass. There is no tenderness.  Genitourinary: No breast swelling, tenderness, discharge or bleeding.  Genitourinary Comments: Breast exam: No mass, nodules, thickening, tenderness, bulging, retraction, inflamation, nipple discharge or skin changes noted.  No axillary or clavicular LA.      Musculoskeletal: Normal range of motion. She exhibits no edema or tenderness.  Lymphadenopathy:    She has no cervical adenopathy.  Neurological: She is alert. She has normal reflexes. She displays no atrophy. No cranial nerve deficit. She exhibits normal muscle tone. Coordination and gait abnormal.  Baseline ataxia/poor coordination and slurred (slow) speech   Skin: Skin is warm and dry. No rash noted. No erythema. No pallor.  Psychiatric: She has a normal mood and affect.          Assessment & Plan:   Problem List Items Addressed This Visit      Cardiovascular and Mediastinum   Essential hypertension - Primary    bp in fair control at this time  BP Readings from Last 1 Encounters:  03/24/17 138/80   No changes needed Disc lifstyle change with low sodium diet and exercise  Better on 2nd check  Labs reviewed  Wt loss enc  Relevant Medications   hydrochlorothiazide (HYDRODIURIL) 25 MG tablet   metoprolol succinate (TOPROL-XL) 50 MG 24 hr tablet     Nervous and Auditory   Spinocerebellar ataxia (HCC) (Chronic)    Gradually worsening  Continues to see neuro Disc fall prev  Uses a walker at all times  Speech is about the same today        Other   Colon cancer screening    Declines colonoscopy  Lacks coordination to do ifob  Disc cologuard-may also have trouble /unsure Given  handout and number to call to inquire       Estrogen deficiency    Ref for dexa      Relevant Orders   DG Bone Density   HYPERCHOLESTEROLEMIA    Worse LDL  Diet not opt- lacks motivation  Cannot have statins  Disc PCYK9 inhib-she is not interested yet Disc goals for lipids and reasons to control them Rev labs with pt Rev low sat fat diet in detail       Relevant Medications   hydrochlorothiazide (HYDRODIURIL) 25 MG tablet   metoprolol succinate (TOPROL-XL) 50 MG 24 hr tablet   Routine general medical examination at a health care facility    Reviewed health habits including diet and exercise and skin cancer prevention Reviewed appropriate screening tests for age  Also reviewed health mt list, fam hx and immunization status , as well as social and family history   See HPI Disc fall risk  Rev AMW Labs reviewed bp better on 2nd check  Disc shingrix-will check with ins  Disc diet for cholesterol Ref for dexa  Declines colonoscopy- and lacks coordination for other screening methods unfortunately       Vitamin D deficiency    Vitamin D level is therapeutic with current supplementation Disc importance of this to bone and overall health Level of 44

## 2017-03-24 NOTE — Assessment & Plan Note (Signed)
Gradually worsening  Continues to see neuro Disc fall prev  Uses a walker at all times  Speech is about the same today

## 2017-03-24 NOTE — Assessment & Plan Note (Signed)
Reviewed health habits including diet and exercise and skin cancer prevention Reviewed appropriate screening tests for age  Also reviewed health mt list, fam hx and immunization status , as well as social and family history   See HPI Disc fall risk  Rev AMW Labs reviewed bp better on 2nd check  Disc shingrix-will check with ins  Disc diet for cholesterol Ref for dexa  Declines colonoscopy- and lacks coordination for other screening methods unfortunately

## 2017-03-24 NOTE — Assessment & Plan Note (Signed)
Vitamin D level is therapeutic with current supplementation Disc importance of this to bone and overall health Level of 44  

## 2017-03-24 NOTE — Telephone Encounter (Signed)
Pt's husband brought in copy of health care POA. Scanned into documents and put copy on cart.

## 2017-03-24 NOTE — Patient Instructions (Addendum)
For cholesterol   Avoid red meat/ fried foods/ egg yolks/ fatty breakfast meats/ butter, cheese and high fat dairy/ and shellfish    If you are interested in doing the cologuard program for screening - just call and let us know  Take a look at the info I gave you and the phone number   There is a new shingles vaccine available - if you are interested let us know   (If you are interested in a shingles/zoster vaccine - call your insurance to check on coverage,( you should not get it within 1 month of other vaccines) , then call us for a prescription  for it to take to a pharmacy that gives the shot , or make a nurse visit to get it here depending on your coverage)   Stop at check out for dexa referral   I will refer you medicines

## 2017-03-24 NOTE — Telephone Encounter (Signed)
Aware, thanks!

## 2017-03-24 NOTE — Assessment & Plan Note (Signed)
Worse LDL  Diet not opt- lacks motivation  Cannot have statins  Disc PCYK9 inhib-she is not interested yet Disc goals for lipids and reasons to control them Rev labs with pt Rev low sat fat diet in detail

## 2017-03-24 NOTE — Progress Notes (Signed)
Pre visit review using our clinic review tool, if applicable. No additional management support is needed unless otherwise documented below in the visit note. 

## 2017-03-24 NOTE — Assessment & Plan Note (Signed)
Declines colonoscopy  Lacks coordination to do ifob  Disc cologuard-may also have trouble /unsure Given handout and number to call to inquire

## 2017-03-24 NOTE — Assessment & Plan Note (Signed)
Ref for dexa 

## 2017-04-29 ENCOUNTER — Ambulatory Visit
Admission: RE | Admit: 2017-04-29 | Discharge: 2017-04-29 | Disposition: A | Discharge status: Home / Self Care | Payer: Medicare PPO | Source: Ambulatory Visit | Attending: Family Medicine | Admitting: Family Medicine

## 2017-04-29 ENCOUNTER — Encounter: Payer: Self-pay | Admitting: Family Medicine

## 2017-04-29 DIAGNOSIS — M85852 Other specified disorders of bone density and structure, left thigh: Secondary | ICD-10-CM | POA: Insufficient documentation

## 2017-04-29 DIAGNOSIS — M8580 Other specified disorders of bone density and structure, unspecified site: Secondary | ICD-10-CM | POA: Insufficient documentation

## 2017-04-29 DIAGNOSIS — Z780 Asymptomatic menopausal state: Secondary | ICD-10-CM | POA: Insufficient documentation

## 2017-04-29 DIAGNOSIS — E2839 Other primary ovarian failure: Secondary | ICD-10-CM

## 2017-09-29 DIAGNOSIS — H25013 Cortical age-related cataract, bilateral: Secondary | ICD-10-CM | POA: Diagnosis not present

## 2017-09-29 DIAGNOSIS — H5203 Hypermetropia, bilateral: Secondary | ICD-10-CM | POA: Diagnosis not present

## 2017-09-29 DIAGNOSIS — H524 Presbyopia: Secondary | ICD-10-CM | POA: Diagnosis not present

## 2017-09-29 DIAGNOSIS — H2513 Age-related nuclear cataract, bilateral: Secondary | ICD-10-CM | POA: Diagnosis not present

## 2017-09-29 DIAGNOSIS — H02052 Trichiasis without entropian right lower eyelid: Secondary | ICD-10-CM | POA: Diagnosis not present

## 2017-12-09 ENCOUNTER — Ambulatory Visit: Payer: Medicare PPO | Admitting: Neurology

## 2017-12-09 ENCOUNTER — Encounter: Payer: Self-pay | Admitting: Neurology

## 2017-12-09 VITALS — BP 156/87 | HR 75 | Ht 63.5 in | Wt 180.5 lb

## 2017-12-09 DIAGNOSIS — G118 Other hereditary ataxias: Secondary | ICD-10-CM

## 2017-12-09 NOTE — Progress Notes (Signed)
Reason for visit: Spinocerebellar ataxia  Jaime Mccarthy is an 77 y.o. female  History of present illness:  Jaime Mccarthy is a 77 year old right-handed white female with a history of spinocerebellar ataxia.  The patient has had gradual progressive problems with ataxic speech and ataxia of all 4 extremities.  The patient continues to fall on a regular basis, at least once a month.  She has a tendency to fall backwards.  She uses a walker for ambulation.  The patient uses a wheelchair outside of the house.  She has no stairs inside the house.  She denies problems with choking with swallowing that are significant.  She has not noted any new issues since last seen.  They have remodeled the bathroom to accommodate her disabilities.  Past Medical History:  Diagnosis Date  . Allergy    allergic rhinitis seasonal  . Ataxia    degenerative cerebellar  . Degenerative arthritis     Left knee  . Dyslipidemia   . Gait disorder   . Hyperlipidemia   . Hypertension   . 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 BREAST SURGERY     left breast needle biopsy negative  . GALLBLADDER SURGERY      Family History  Problem Relation Age of Onset  . COPD Mother   . Cancer Mother        breast Ca  . Heart disease Father        CAD  . Cancer Father        lung CA  . Depression Daughter   . Heart disease Maternal Grandmother        heart failure    Social history:  reports that she has quit smoking. she has never used smokeless tobacco. She reports that she does not drink alcohol or use drugs.   No Known Allergies  Medications:  Prior to Admission medications   Medication Sig Start Date End Date Taking? Authorizing Provider  acetaminophen (TYLENOL) 325 MG tablet Take 650 mg by mouth every 6 (six) hours as needed.   Yes [provider]  aspirin 325 MG tablet Take 325 mg by mouth daily.   Yes [provider]  cholecalciferol (VITAMIN D)  1000 units tablet Take 2,000 Units by mouth daily.    Yes [provider]  Flaxseed, Linseed, (FLAX SEED OIL) 1000 MG CAPS Take 2 capsules by mouth daily.     Yes [provider]  hydrochlorothiazide (HYDRODIURIL) 25 MG tablet Take 1 tablet (25 mg total) by mouth daily. 03/24/17  Yes Tower, Wynelle Fanny, MD  metoprolol succinate (TOPROL-XL) 50 MG 24 hr tablet Take 1/2 pill by mouth once daily 03/24/17  Yes Tower, Wynelle Fanny, MD  MULTIPLE VITAMIN PO Take by mouth daily.   Yes [provider]    ROS:  Out of a complete 14 system review of symptoms, the patient complains only of the following symptoms, and all other reviewed systems are negative.  Runny nose Eye itching Cough Speech difficulty   Blood pressure (!) 156/87, pulse 75, height 5' 3.5" (1.613 m), weight 180 lb 8 oz (81.9 kg).  Physical Exam  General: The patient is alert and cooperative at the time of the examination.  Skin: No significant peripheral edema is noted.   Neurologic Exam  Mental status: The patient is alert and oriented x 3 at the time of the examination. The patient has apparent normal recent and remote memory,  with an apparently normal attention span and concentration ability.   Cranial nerves: Facial symmetry is present. Speech is ataxic, not a phasic. Extraocular movements are full. Visual fields are full.  Motor: The patient has good strength in all 4 extremities.  Sensory examination: Soft touch sensation is symmetric on the face, arms, and legs.  Coordination: The patient has dysmetria of all 4 extremities with finger-nose-finger and heel to shin.  Gait and station: The patient has a wide-based, ataxic gait, she is unable to walk without assistance.  She has a tendency to lean backwards.  Reflexes: Deep tendon reflexes are symmetric.   Assessment/Plan:  1.  Spinocerebellar ataxia  2.  Gait disorder  The patient continues to progress slowly, in the future she may require a  motorized wheelchair, she will follow-up otherwise in 1 year.  She is to remain as safe as possible.  I have suggested weighting the wrists to help her eat a bit better without as much ataxia.  Jill Alexanders MD 12/09/2017 10:07 AM  [facility] [address] [place], [place] [address]  Phone [phone] Fax [phone]

## 2017-12-29 ENCOUNTER — Encounter: Payer: Self-pay | Admitting: Family Medicine

## 2017-12-29 DIAGNOSIS — Z1231 Encounter for screening mammogram for malignant neoplasm of breast: Secondary | ICD-10-CM | POA: Diagnosis not present

## 2017-12-29 DIAGNOSIS — Z803 Family history of malignant neoplasm of breast: Secondary | ICD-10-CM | POA: Diagnosis not present

## 2018-01-05 DIAGNOSIS — R921 Mammographic calcification found on diagnostic imaging of breast: Secondary | ICD-10-CM | POA: Diagnosis not present

## 2018-02-18 DIAGNOSIS — Z85820 Personal history of malignant melanoma of skin: Secondary | ICD-10-CM | POA: Diagnosis not present

## 2018-02-18 DIAGNOSIS — D1801 Hemangioma of skin and subcutaneous tissue: Secondary | ICD-10-CM | POA: Diagnosis not present

## 2018-02-18 DIAGNOSIS — Z85828 Personal history of other malignant neoplasm of skin: Secondary | ICD-10-CM | POA: Diagnosis not present

## 2018-02-18 DIAGNOSIS — L218 Other seborrheic dermatitis: Secondary | ICD-10-CM | POA: Diagnosis not present

## 2018-02-18 DIAGNOSIS — L821 Other seborrheic keratosis: Secondary | ICD-10-CM | POA: Diagnosis not present

## 2018-02-18 DIAGNOSIS — D225 Melanocytic nevi of trunk: Secondary | ICD-10-CM | POA: Diagnosis not present

## 2018-03-24 ENCOUNTER — Ambulatory Visit: Payer: Self-pay

## 2018-03-24 ENCOUNTER — Other Ambulatory Visit: Payer: Self-pay

## 2018-03-24 ENCOUNTER — Ambulatory Visit (INDEPENDENT_AMBULATORY_CARE_PROVIDER_SITE_OTHER): Payer: Medicare PPO

## 2018-03-24 VITALS — BP 126/82 | HR 70 | Temp 97.9°F | Wt 179.2 lb

## 2018-03-24 DIAGNOSIS — E559 Vitamin D deficiency, unspecified: Secondary | ICD-10-CM

## 2018-03-24 DIAGNOSIS — E7800 Pure hypercholesterolemia, unspecified: Secondary | ICD-10-CM

## 2018-03-24 DIAGNOSIS — I10 Essential (primary) hypertension: Secondary | ICD-10-CM

## 2018-03-24 DIAGNOSIS — Z0000 Encounter for general adult medical examination without abnormal findings: Secondary | ICD-10-CM | POA: Diagnosis not present

## 2018-03-24 LAB — CBC WITH DIFFERENTIAL/PLATELET
BASOS ABS: 0 10*3/uL (ref 0.0–0.1)
Basophils Relative: 0.4 % (ref 0.0–3.0)
EOS PCT: 1.7 % (ref 0.0–5.0)
Eosinophils Absolute: 0.1 10*3/uL (ref 0.0–0.7)
HEMATOCRIT: 43.3 % (ref 36.0–46.0)
HEMOGLOBIN: 14.5 g/dL (ref 12.0–15.0)
LYMPHS PCT: 31 % (ref 12.0–46.0)
Lymphs Abs: 2.7 10*3/uL (ref 0.7–4.0)
MCHC: 33.4 g/dL (ref 30.0–36.0)
MCV: 90.1 fl (ref 78.0–100.0)
MONOS PCT: 8.4 % (ref 3.0–12.0)
Monocytes Absolute: 0.7 10*3/uL (ref 0.1–1.0)
Neutro Abs: 5.1 10*3/uL (ref 1.4–7.7)
Neutrophils Relative %: 58.5 % (ref 43.0–77.0)
Platelets: 336 10*3/uL (ref 150.0–400.0)
RBC: 4.81 Mil/uL (ref 3.87–5.11)
RDW: 15.3 % (ref 11.5–15.5)
WBC: 8.8 10*3/uL (ref 4.0–10.5)

## 2018-03-24 LAB — LIPID PANEL
CHOL/HDL RATIO: 4
Cholesterol: 242 mg/dL — ABNORMAL HIGH (ref 0–200)
HDL: 58.2 mg/dL (ref >39.00)
LDL CALC: 155 mg/dL — AB (ref 0–99)
NonHDL: 183.34
TRIGLYCERIDES: 141 mg/dL (ref 0.0–149.0)
VLDL: 28.2 mg/dL (ref 0.0–40.0)

## 2018-03-24 LAB — COMPREHENSIVE METABOLIC PANEL
ALK PHOS: 76 U/L (ref 39–117)
ALT: 34 U/L (ref 0–35)
AST: 30 U/L (ref 0–37)
Albumin: 4.3 g/dL (ref 3.5–5.2)
BILIRUBIN TOTAL: 0.5 mg/dL (ref 0.2–1.2)
BUN: 14 mg/dL (ref 6–23)
CALCIUM: 9.9 mg/dL (ref 8.4–10.5)
CO2: 30 mEq/L (ref 19–32)
Chloride: 99 mEq/L (ref 96–112)
Creatinine, Ser: 0.74 mg/dL (ref 0.40–1.20)
GFR: 81.46 mL/min (ref >60.00)
Glucose, Bld: 86 mg/dL (ref 70–99)
POTASSIUM: 3.5 meq/L (ref 3.5–5.1)
Sodium: 137 mEq/L (ref 135–145)
TOTAL PROTEIN: 7.5 g/dL (ref 6.0–8.3)

## 2018-03-24 LAB — VITAMIN D 25 HYDROXY (VIT D DEFICIENCY, FRACTURES): VITD: 39.32 ng/mL (ref 30.00–100.00)

## 2018-03-24 LAB — TSH: TSH: 2.96 u[IU]/mL (ref 0.35–4.50)

## 2018-03-24 MED ORDER — HYDROCHLOROTHIAZIDE 25 MG PO TABS: 25.0000 mg | ORAL_TABLET | Freq: Every day | ORAL | 3 refills | Status: DC

## 2018-03-24 MED ORDER — METOPROLOL SUCCINATE ER 50 MG PO TB24: ORAL_TABLET | ORAL | 3 refills | Status: DC

## 2018-03-24 NOTE — Progress Notes (Signed)
Subjective:   Jaime Mccarthy is a 76 y.o. female who presents for Medicare Annual (Subsequent) preventive examination.  Review of Systems:  N/A Cardiac Risk Factors include: advanced age (>110men, >89 women);obesity (BMI >30kg/m2);dyslipidemia;hypertension     Objective:     Vitals: BP 126/82 (BP Location: Right Arm, Patient Position: Sitting, Cuff Size: Normal)   Pulse 70   Temp 97.9 F (36.6 C) (Oral)   Wt 179 lb 4 oz (81.3 kg)   SpO2 97%   BMI 31.25 kg/m   Body mass index is 31.25 kg/m.  Advanced Directives 03/20/2017 12/05/2015 10/25/2014  Does Patient Have a Medical Advance Directive? Yes Yes No  Type of Paramedic of [place];Living will [place];Living will -  Copy of [place] in Chart? No - copy requested - -    Tobacco Social History   Tobacco Use  Smoking Status Former Smoker  Smokeless Tobacco Never Used     Counseling given: No   Clinical Intake:  Pre-visit preparation completed: Yes  Pain : No/denies pain Pain Score: 0-No pain     Nutritional Status: BMI > 30  Obese Nutritional Risks: None Diabetes: No  How often do you need to have someone help you when you read instructions, pamphlets, or other written materials from your doctor or pharmacy?: 1 - Never What is the last grade level you completed in school?: 12th grade + 2 yrs college  Interpreter Needed?: No  Comments: pt lives with spouse Information entered by :: LPinson, LPN  Past Medical History:  Diagnosis Date  . Allergy    allergic rhinitis seasonal  . Ataxia    degenerative cerebellar  . Degenerative arthritis     Left knee  . Dyslipidemia   . Gait disorder   . Hyperlipidemia   . Hypertension   . 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 BREAST SURGERY     left breast needle biopsy negative  . GALLBLADDER SURGERY     Family History  Problem Relation Age of Onset    . COPD Mother   . Cancer Mother        breast Ca  . Heart disease Father        CAD  . Cancer Father        lung CA  . Depression Daughter   . Heart disease Maternal Grandmother        heart failure   Social History   Socioeconomic History  . Marital status: Married    Spouse name: Not on file  . Number of children: 1  . Years of education: 69  . Highest education level: Not on file  Occupational History    Employer: [employer]  Social Needs  . Financial resource strain: Not on file  . Food insecurity:    Worry: Not on file    Inability: Not on file  . Transportation needs:    Medical: Not on file    Non-medical: Not on file  Tobacco Use  . Smoking status: Former Research scientist (life sciences)  . Smokeless tobacco: Never Used  Substance and Sexual Activity  . Alcohol use: No    Comment: occasional  . Drug use: No  . Sexual activity: Not on file  Lifestyle  . Physical activity:    Days per week: Not on file    Minutes per session: Not on file  . Stress: Not on file  Relationships  . Social  connections:    Talks on phone: Not on file    Gets together: Not on file    Attends religious service: Not on file    Active member of club or organization: Not on file    Attends meetings of clubs or organizations: Not on file    Relationship status: Not on file  Other Topics Concern  . Not on file  Social History Narrative   Patient drinks about 2 cups of caffeine daily.   Patient is right handed.     Outpatient Encounter Medications as of 03/24/2018  Medication Sig  . acetaminophen (TYLENOL) 325 MG tablet Take 650 mg by mouth every 6 (six) hours as needed.  Marland Kitchen aspirin 325 MG tablet Take 325 mg by mouth daily.  . cholecalciferol (VITAMIN D) 1000 units tablet Take 2,000 Units by mouth daily.   . Flaxseed, Linseed, (FLAX SEED OIL) 1000 MG CAPS Take 2 capsules by mouth daily.    . hydrochlorothiazide (HYDRODIURIL) 25 MG tablet Take 1 tablet (25 mg total) by mouth daily.  . metoprolol  succinate (TOPROL-XL) 50 MG 24 hr tablet Take 1/2 pill by mouth once daily  . MULTIPLE VITAMIN PO Take by mouth daily.   No facility-administered encounter medications on file as of 03/24/2018.     Activities of Daily Living In your present state of health, do you have any difficulty performing the following activities: 03/24/2018  Hearing? N  Vision? Y  Difficulty concentrating or making decisions? N  Walking or climbing stairs? Y  Dressing or bathing? N  Doing errands, shopping? Y  Preparing Food and eating ? N  Using the Toilet? N  In the past six months, have you accidently leaked urine? Y  Do you have problems with loss of bowel control? Y  Managing your Medications? N  Managing your Finances? Y  Housekeeping or managing your Housekeeping? Y  Some recent data might be hidden    Patient Care Team: Tower, Wynelle Fanny, MD as PCP - General Thelma Comp, [place] as Consulting Physician (Optometry)    Assessment:   This is a routine wellness examination for Jaime Mccarthy.   Hearing Screening   125Hz  250Hz  500Hz  1000Hz  2000Hz  3000Hz  4000Hz  6000Hz  8000Hz   Right ear:   0 40 40  40    Left ear:   40 40 40  40    Vision Screening Comments: Last vision exam in Jan 2019  Exercise Activities and Dietary recommendations Current Exercise Habits: The patient does not participate in regular exercise at present, Exercise limited by: orthopedic condition(s)  Goals    . Follow up with Primary Care Provider     Starting 03/24/2018, I will continue to take medications as prescribed and to keep appointments with PCP as scheduled.        Fall Risk Fall Risk  03/24/2018 03/20/2017 03/19/2016 03/13/2015 11/26/2013  Falls in the past year? Yes Yes Yes Yes Yes  Comment multiple falls; 1-2 mth due to loss of balance; denies injury - - - -  Number falls in past yr: 2 or more 2 or more 2 or more 2 or more 2 or more  Injury with Fall? No No No No -  Risk Factor Category  High Fall Risk - High Fall Risk High  Fall Risk High Fall Risk  Risk for fall due to : History of fall(s);Impaired balance/gait;Impaired mobility;Impaired vision Impaired balance/gait;Impaired mobility - - Impaired balance/gait;Impaired mobility   Depression Screen PHQ 2/9 Scores 03/24/2018 03/20/2017 03/19/2016 03/13/2015  PHQ -  2 Score 0 0 0 0  PHQ- 9 Score 0 - - -     Cognitive Function MMSE - Mini Mental State Exam 03/24/2018 03/20/2017  Orientation to time 5 5  Orientation to Place 5 5  Registration 3 3  Attention/ Calculation 0 0  Recall 3 3  Language- name 2 objects 0 0  Language- repeat 1 1  Language- follow 3 step command 3 3  Language- read & follow direction 0 0  Write a sentence 0 0  Copy design 0 0  Total score 20 20     PLEASE NOTE: A Mini-Cog screen was completed. Maximum score is 20. A value of 0 denotes this part of [place] MMSE was not completed or the patient failed this part of the Mini-Cog screening.   Mini-Cog Screening Orientation to Time - Max 5 pts Orientation to Place - Max 5 pts Registration - Max 3 pts Recall - Max 3 pts Language Repeat - Max 1 pts Language Follow 3 Step Command - Max 3 pts     Immunization History  Administered Date(s) Administered  . Influenza Split 12/09/2012  . Influenza,inj,Quad PF,6+ Mos 11/26/2013  . Influenza-Unspecified 09/28/2015  . Pneumococcal Conjugate-13 03/13/2015  . Pneumococcal Polysaccharide-23 11/18/2012  . Td 03/21/2005, 03/13/2015  . Zoster 09/05/2014    Screening Tests Health Maintenance  Topic Date Due  . COLONOSCOPY  01/03/2021 (Originally 11/07/2013)  . INFLUENZA VACCINE  07/02/2018  . MAMMOGRAM  01/05/2019  . TETANUS/TDAP  03/12/2025  . DEXA SCAN  Completed  . PNA vac Low Risk Adult  Completed       Plan:     I have personally reviewed, addressed, and noted the following in the patient's chart:  A. Medical and social history B. Use of alcohol, tobacco or illicit drugs  C. Current medications and supplements D. Functional  ability and status E.  Nutritional status F.  Physical activity G. Advance directives H. List of other physicians I.  Hospitalizations, surgeries, and ER visits in previous 12 months J.  [place] to include hearing, vision, cognitive, depression L. Referrals and appointments - none  In addition, I have reviewed and discussed with patient certain preventive protocols, quality metrics, and best practice recommendations. A written personalized care plan for preventive services as well as general preventive health recommendations were provided to patient.  See attached scanned questionnaire for additional information.   Signed,   Lindell Noe, MHA, BS, LPN Health Coach

## 2018-03-24 NOTE — Progress Notes (Signed)
PCP notes:   Health maintenance:  No gaps identified.   Abnormal screenings:   Hearing - failed  Hearing Screening   125Hz  250Hz  500Hz  1000Hz  2000Hz  3000Hz  4000Hz  6000Hz  8000Hz   Right ear:   0 40 40  40    Left ear:   40 40 40  40     Fall risk - hx of multiple falls Fall Risk  03/24/2018 03/20/2017 03/19/2016 03/13/2015 11/26/2013  Falls in the past year? Yes Yes Yes Yes Yes  Comment multiple falls; 1-2 mth due to loss of balance; denies injury - - - -  Number falls in past yr: 2 or more 2 or more 2 or more 2 or more 2 or more  Injury with Fall? No No No No -  Risk Factor Category  High Fall Risk - High Fall Risk High Fall Risk High Fall Risk  Risk for fall due to : History of fall(s);Impaired balance/gait;Impaired mobility;Impaired vision Impaired balance/gait;Impaired mobility - - Impaired balance/gait;Impaired mobility   Patient concerns:   Medication refill - request sent to PCP  Nurse concerns:  None  Next PCP appt:   03/25/18 @ 1130  I reviewed health advisor's note, was available for consultation, and agree with documentation and plan. Loura Pardon MD

## 2018-03-24 NOTE — Patient Instructions (Addendum)
Jaime Mccarthy , Thank you for taking time to come for your Medicare Wellness Visit. I appreciate your ongoing commitment to your health goals. Please review the following plan we discussed and let me know if I can assist you in the future.   These are the goals we discussed: Goals    . Follow up with Primary Care Provider     Starting 03/24/2018, I will continue to take medications as prescribed and to keep appointments with PCP as scheduled.        This is a list of the screening recommended for you and due dates:  Health Maintenance  Topic Date Due  . Colon Cancer Screening  01/03/2021*  . Flu Shot  07/02/2018  . Mammogram  01/05/2019  . Tetanus Vaccine  03/12/2025  . DEXA scan (bone density measurement)  Completed  . Pneumonia vaccines  Completed  *Topic was postponed. The date shown is not the original due date.   Preventive Care for Adults  A healthy lifestyle and preventive care can promote health and wellness. Preventive health guidelines for adults include the following key practices.  . A routine yearly physical is a good way to check with your health care provider about your health and preventive screening. It is a chance to share any concerns and updates on your health and to receive a thorough exam.  . Visit your dentist for a routine exam and preventive care every 6 months. Brush your teeth twice a day and floss once a day. Good oral hygiene prevents tooth decay and gum disease.  . The frequency of eye exams is based on your age, health, family medical history, use  of contact lenses, and other factors. Follow your health care provider's recommendations for frequency of eye exams.  . Eat a healthy diet. Foods like vegetables, fruits, whole grains, low-fat dairy products, and lean protein foods contain the nutrients you need without too many calories. Decrease your intake of foods high in solid fats, added sugars, and salt. Eat the right amount of calories for you. Get  information about a proper diet from your health care provider, if necessary.  . Regular physical exercise is one of the most important things you can do for your health. Most adults should get at least 150 minutes of moderate-intensity exercise (any activity that increases your heart rate and causes you to sweat) each week. In addition, most adults need muscle-strengthening exercises on 2 or more days a week.  Silver Sneakers may be a benefit available to you. To determine eligibility, you may visit the website: www.silversneakers.com or contact program at 317-422-3530 Mon-Fri between 8AM-8PM.   . Maintain a healthy weight. The body mass index (BMI) is a screening tool to identify possible weight problems. It provides an estimate of body fat based on height and weight. Your health care provider can find your BMI and can help you achieve or maintain a healthy weight.   For adults 20 years and older: ? A BMI below 18.5 is considered underweight. ? A BMI of 18.5 to 24.9 is normal. ? A BMI of 25 to 29.9 is considered overweight. ? A BMI of 30 and above is considered obese.   . Maintain normal blood lipids and cholesterol levels by exercising and minimizing your intake of saturated fat. Eat a balanced diet with plenty of fruit and vegetables. Blood tests for lipids and cholesterol should begin at age 46 and be repeated every 5 years. If your lipid or cholesterol levels are high,  you are over 50, or you are at high risk for heart disease, you may need your cholesterol levels checked more frequently. Ongoing high lipid and cholesterol levels should be treated with medicines if diet and exercise are not working.  . If you smoke, find out from your health care provider how to quit. If you do not use tobacco, please do not start.  . If you choose to drink alcohol, please do not consume more than 2 drinks per day. One drink is considered to be 12 ounces (355 mL) of beer, 5 ounces (148 mL) of wine, or 1.5  ounces (44 mL) of liquor.  . If you are 3-51 years old, ask your health care provider if you should take aspirin to prevent strokes.  . Use sunscreen. Apply sunscreen liberally and repeatedly throughout the day. You should seek shade when your shadow is shorter than you. Protect yourself by wearing long sleeves, pants, a wide-brimmed hat, and sunglasses year round, whenever you are outdoors.  . Once a month, do a whole body skin exam, using a mirror to look at the skin on your back. Tell your health care provider of new moles, moles that have irregular borders, moles that are larger than a pencil eraser, or moles that have changed in shape or color.

## 2018-03-24 NOTE — Telephone Encounter (Signed)
Patient in office today for AWV. Requested refills for HCTZ and Metoprolol Succinate. [facility]

## 2018-03-25 ENCOUNTER — Ambulatory Visit (INDEPENDENT_AMBULATORY_CARE_PROVIDER_SITE_OTHER): Payer: Medicare PPO | Admitting: Family Medicine

## 2018-03-25 ENCOUNTER — Encounter: Payer: Self-pay | Admitting: Family Medicine

## 2018-03-25 VITALS — BP 128/62 | HR 64 | Temp 97.9°F | Wt 179.2 lb

## 2018-03-25 DIAGNOSIS — Z0000 Encounter for general adult medical examination without abnormal findings: Secondary | ICD-10-CM

## 2018-03-25 DIAGNOSIS — N6019 Diffuse cystic mastopathy of unspecified breast: Secondary | ICD-10-CM | POA: Diagnosis not present

## 2018-03-25 DIAGNOSIS — M85859 Other specified disorders of bone density and structure, unspecified thigh: Secondary | ICD-10-CM

## 2018-03-25 DIAGNOSIS — E559 Vitamin D deficiency, unspecified: Secondary | ICD-10-CM | POA: Diagnosis not present

## 2018-03-25 DIAGNOSIS — H6123 Impacted cerumen, bilateral: Secondary | ICD-10-CM

## 2018-03-25 DIAGNOSIS — I10 Essential (primary) hypertension: Secondary | ICD-10-CM | POA: Diagnosis not present

## 2018-03-25 DIAGNOSIS — E7800 Pure hypercholesterolemia, unspecified: Secondary | ICD-10-CM | POA: Diagnosis not present

## 2018-03-25 DIAGNOSIS — G118 Other hereditary ataxias: Secondary | ICD-10-CM | POA: Diagnosis not present

## 2018-03-25 MED ORDER — HYDROCHLOROTHIAZIDE 25 MG PO TABS: 25.0000 mg | ORAL_TABLET | Freq: Every day | ORAL | 3 refills | Status: DC

## 2018-03-25 MED ORDER — METOPROLOL SUCCINATE ER 50 MG PO TB24: ORAL_TABLET | ORAL | 3 refills | Status: DC

## 2018-03-25 NOTE — Patient Instructions (Addendum)
google "chair exercise"  There is a lot out there  Also do your physical therapy  Do not allow yourself to get weak on top of other problems  Make sure to take your vitamin D every day   For cholesterol  Avoid red meat/ fried foods/ egg yolks/ fatty breakfast meats/ butter, cheese and high fat dairy/ and shellfish   Your numbers are slightly improved   Instead of sausage biscuits-try [place] sausage on english muffin

## 2018-03-25 NOTE — Assessment & Plan Note (Signed)
Reviewed health habits including diet and exercise and skin cancer prevention Reviewed appropriate screening tests for age  Also reviewed health mt list, fam hx and immunization status , as well as social and family history   See HPI Labs rev amw rev  Disc imp of ca and D Also exercise as able  Declines any colon screening  F/u breast imaging in aug  Disc low chol diet  Also enc wt loss

## 2018-03-25 NOTE — Assessment & Plan Note (Signed)
No new tx options Continues neuro f/u  Did PT and still using walker when she can  Stressed the imp of strength and fitness to help her stay independent longer Disc with she and husband/ plan for exercise -chair or bike if able  (he is willing to help) Continue home PT as well

## 2018-03-25 NOTE — Assessment & Plan Note (Signed)
dexa 5/18 Mild No fx  High fall risk Stressed imp of ca and D and exercise  dexa every 2 y

## 2018-03-25 NOTE — Assessment & Plan Note (Signed)
bp in fair control at this time  BP Readings from Last 1 Encounters:  03/25/18 128/62   No changes needed Disc lifstyle change with low sodium diet and exercise  Labs reviewed

## 2018-03-25 NOTE — Assessment & Plan Note (Signed)
Pt will f/u for 6 mo breast imaging in aug  microcalc in L breast Nl exam today- dense

## 2018-03-25 NOTE — Assessment & Plan Note (Signed)
Partial-bilaterally Adv use of debrox or hydrogen peroxide prn  F/u to irrigate if needed

## 2018-03-25 NOTE — Assessment & Plan Note (Signed)
Level of 39 Vitamin D level is therapeutic with current supplementation Disc importance of this to bone and overall health Enc not to miss doses

## 2018-03-25 NOTE — Assessment & Plan Note (Signed)
Disc goals for lipids and reasons to control them Rev labs with pt Rev low sat fat diet in detail Unable to take stain due to neuro/muscle problems  LDL is improved Disc diet

## 2018-03-25 NOTE — Progress Notes (Signed)
Subjective:    Patient ID: Jaime Mccarthy, female    DOB: 10/15/1941, 77 y.o.   MRN: [id number]  HPI Here for health maintenance exam and to review chronic medical problems    Doing well overall  Feeling pretty good   Wt Readings from Last 3 Encounters:  03/25/18 179 lb 4 oz (81.3 kg)  03/24/18 179 lb 4 oz (81.3 kg)  12/09/17 180 lb 8 oz (81.9 kg)  wt is stable  Not eating healthy- eats a lot of take out -can no longer cook with neuro problems  Not doing any chair exercise  She uses a walker at least 30 minutes of the day when she can at home  She did PT at neuro (has exercises to do) and has a bike if husband can get her on it  31.25 kg/m   Had amw on 4/23 Missed low Hz in R ear only- does not notice it much  High fall risk from neurologic impairment -aware Falls on avg 1-2 times per month-no fx   Colon cancer screening -decided to stop entirely  Cannot do stool tests due to coordination issues   Mammogram 2/19-needed addnl views showed L breast calcifications and 6 mo recall was advised  Self breast exam-no lumps (but knows she has fibrocystic breasts)  Mother had breast cancer   dexa 5/18  Very mild osteopenia Has falls  fx hx  Ca/D D level is 39.3 Stays as active as you can be   zostavax 10/15  H/o progressive spinocerebellar ataxia  Neuro follows  Still goes once a year  Not a lot they can do at this point    bp is stable today  No cp or palpitations or headaches or edema  No side effects to medicines  BP Readings from Last 3 Encounters:  03/25/18 128/62  03/24/18 126/82  12/09/17 (!) 156/87     Hyperlipidemia Lab Results  Component Value Date   CHOL 242 (H) 03/24/2018   CHOL 266 (H) 03/20/2017   CHOL 251 (H) 03/14/2016   Lab Results  Component Value Date   HDL 58.20 03/24/2018   HDL 60.20 03/20/2017   HDL 56.50 03/14/2016   Lab Results  Component Value Date   LDLCALC 155 (H) 03/24/2018   LDLCALC 177 (H) 03/20/2017   LDLCALC 160 (H)  03/14/2016   Lab Results  Component Value Date   TRIG 141.0 03/24/2018   TRIG 144.0 03/20/2017   TRIG 173.0 (H) 03/14/2016   Lab Results  Component Value Date   CHOLHDL 4 03/24/2018   CHOLHDL 4 03/20/2017   CHOLHDL 4 03/14/2016   Lab Results  Component Value Date   LDLDIRECT 186.5 11/18/2013   LDLDIRECT 147.0 09/25/2012   LDLDIRECT 133.8 03/03/2012   No statins due to neuro state  LDL is down a bit  Unsure if she eats differently  Seldom beef or fried food  Other labs  Results for orders placed or performed in visit on 03/24/18  Comprehensive metabolic panel  Result Value Ref Range   Sodium 137 135 - 145 mEq/L   Potassium 3.5 3.5 - 5.1 mEq/L   Chloride 99 96 - 112 mEq/L   CO2 30 19 - 32 mEq/L   Glucose, Bld 86 70 - 99 mg/dL   BUN 14 6 - 23 mg/dL   Creatinine, Ser 0.74 0.40 - 1.20 mg/dL   Total Bilirubin 0.5 0.2 - 1.2 mg/dL   Alkaline Phosphatase 76 39 - 117 U/L   AST 30  0 - 37 U/L   ALT 34 0 - 35 U/L   Total Protein 7.5 6.0 - 8.3 g/dL   Albumin 4.3 3.5 - 5.2 g/dL   Calcium 9.9 8.4 - 10.5 mg/dL   GFR 81.46 >60.00 mL/min  CBC with Differential/Platelet  Result Value Ref Range   WBC 8.8 4.0 - 10.5 K/uL   RBC 4.81 3.87 - 5.11 Mil/uL   Hemoglobin 14.5 12.0 - 15.0 g/dL   HCT 43.3 36.0 - 46.0 %   MCV 90.1 78.0 - 100.0 fl   MCHC 33.4 30.0 - 36.0 g/dL   RDW 15.3 11.5 - 15.5 %   Platelets 336.0 150.0 - 400.0 K/uL   Neutrophils Relative % 58.5 43.0 - 77.0 %   Lymphocytes Relative 31.0 12.0 - 46.0 %   Monocytes Relative 8.4 3.0 - 12.0 %   Eosinophils Relative 1.7 0.0 - 5.0 %   Basophils Relative 0.4 0.0 - 3.0 %   Neutro Abs 5.1 1.4 - 7.7 K/uL   Lymphs Abs 2.7 0.7 - 4.0 K/uL   Monocytes Absolute 0.7 0.1 - 1.0 K/uL   Eosinophils Absolute 0.1 0.0 - 0.7 K/uL   Basophils Absolute 0.0 0.0 - 0.1 K/uL  TSH  Result Value Ref Range   TSH 2.96 0.35 - 4.50 uIU/mL  Lipid Panel  Result Value Ref Range   Cholesterol 242 (H) 0 - 200 mg/dL   Triglycerides 141.0 0.0 - 149.0  mg/dL   HDL 58.20 >39.00 mg/dL   VLDL 28.2 0.0 - 40.0 mg/dL   LDL Cholesterol 155 (H) 0 - 99 mg/dL   Total CHOL/HDL Ratio 4    NonHDL 183.34   Vitamin D, 25-hydroxy  Result Value Ref Range   VITD 39.32 30.00 - 100.00 ng/mL       Review of Systems  Constitutional: Negative for activity change, appetite change, fatigue, fever and unexpected weight change.  HENT: Negative for congestion, ear pain, rhinorrhea, sinus pressure and sore throat.   Eyes: Negative for pain, redness and visual disturbance.  Respiratory: Negative for cough, shortness of breath and wheezing.   Cardiovascular: Negative for chest pain and palpitations.  Gastrointestinal: Negative for abdominal pain, blood in stool, constipation and diarrhea.  Endocrine: Negative for polydipsia and polyuria.  Genitourinary: Negative for dysuria, frequency and urgency.  Musculoskeletal: Negative for arthralgias, back pain and myalgias.  Skin: Negative for pallor and rash.  Allergic/Immunologic: Negative for environmental allergies.  Neurological: Positive for dizziness, tremors and speech difficulty. Negative for seizures, syncope, facial asymmetry, light-headedness, numbness and headaches.       Ataxia Dizziness Falls  Baseline slow speech  Hematological: Negative for adenopathy. Does not bruise/bleed easily.  Psychiatric/Behavioral: Negative for decreased concentration and dysphoric mood. The patient is not nervous/anxious.        Objective:   Physical Exam  Constitutional: She appears well-developed and well-nourished. No distress.  obese and well appearing Wheelchair bound-exam done in chair  HENT:  Head: Normocephalic and atraumatic.  Right Ear: External ear normal.  Left Ear: External ear normal.  Mouth/Throat: Oropharynx is clear and moist.  Nares are boggy Some pnd  Eyes: Pupils are equal, round, and reactive to light. Conjunctivae and EOM are normal. No scleral icterus.  Neck: Normal range of motion. Neck  supple. No JVD present. Carotid bruit is not present. No thyromegaly present.  Cardiovascular: Normal rate, regular rhythm, normal heart sounds and intact distal pulses. Exam reveals no gallop.  Pulmonary/Chest: Effort normal and breath sounds normal. No respiratory distress. She  has no wheezes. She exhibits no tenderness. No breast swelling, tenderness, discharge or bleeding.  occ dry cough  Abdominal: Soft. Bowel sounds are normal. She exhibits no distension, no abdominal bruit and no mass. There is no tenderness.  Genitourinary: No breast swelling, tenderness, discharge or bleeding.  Genitourinary Comments: Breast exam: No mass, nodules, thickening, tenderness, bulging, retraction, inflamation, nipple discharge or skin changes noted.  No axillary or clavicular LA.    Dense breast tissue  Musculoskeletal: Normal range of motion. She exhibits no edema or tenderness.  Lymphadenopathy:    She has no cervical adenopathy.  Neurological: She is alert. She has normal reflexes. No cranial nerve deficit. She exhibits normal muscle tone. Coordination normal.  Cerebellar ataxia-progressed Unable to stop moving feet to examine/put shoes on  Exam done in chair  Mild tremor  Baseline slow speech  Skin: Skin is warm and dry. No rash noted. No erythema. No pallor.  Solar lentigines diffusely Fair complexion   Few angiomas on legs   Psychiatric: She has a normal mood and affect.  Cheerful and talkative           Assessment & Plan:   Problem List Items Addressed This Visit      Cardiovascular and Mediastinum   Essential hypertension    bp in fair control at this time  BP Readings from Last 1 Encounters:  03/25/18 128/62   No changes needed Disc lifstyle change with low sodium diet and exercise  Labs reviewed       Relevant Medications   hydrochlorothiazide (HYDRODIURIL) 25 MG tablet   metoprolol succinate (TOPROL-XL) 50 MG 24 hr tablet     Nervous and Auditory   Cerumen impaction     Partial-bilaterally Adv use of debrox or hydrogen peroxide prn  F/u to irrigate if needed       Spinocerebellar ataxia (HCC) (Chronic)    No new tx options Continues neuro f/u  Did PT and still using walker when she can  Stressed the imp of strength and fitness to help her stay independent longer Disc with she and husband/ plan for exercise -chair or bike if able  (he is willing to help) Continue home PT as well         Musculoskeletal and Integument   Osteopenia    dexa 5/18 Mild No fx  High fall risk Stressed imp of ca and D and exercise  dexa every 2 y        Other   FIBROCYSTIC BREAST DISEASE    Pt will f/u for 6 mo breast imaging in aug  microcalc in L breast Nl exam today- dense      HYPERCHOLESTEROLEMIA    Disc goals for lipids and reasons to control them Rev labs with pt Rev low sat fat diet in detail Unable to take stain due to neuro/muscle problems  LDL is improved Disc diet      Relevant Medications   hydrochlorothiazide (HYDRODIURIL) 25 MG tablet   metoprolol succinate (TOPROL-XL) 50 MG 24 hr tablet   Routine general medical examination at a health care facility - Primary    Reviewed health habits including diet and exercise and skin cancer prevention Reviewed appropriate screening tests for age  Also reviewed health mt list, fam hx and immunization status , as well as social and family history   See HPI Labs rev amw rev  Disc imp of ca and D Also exercise as able  Declines any colon screening  F/u breast imaging in  aug  Disc low chol diet  Also enc wt loss       Vitamin D deficiency    Level of 39 Vitamin D level is therapeutic with current supplementation Disc importance of this to bone and overall health Enc not to miss doses

## 2018-07-06 ENCOUNTER — Encounter: Payer: Self-pay | Admitting: Family Medicine

## 2018-07-06 DIAGNOSIS — R921 Mammographic calcification found on diagnostic imaging of breast: Secondary | ICD-10-CM | POA: Diagnosis not present

## 2018-08-11 DIAGNOSIS — D485 Neoplasm of uncertain behavior of skin: Secondary | ICD-10-CM | POA: Diagnosis not present

## 2018-08-11 DIAGNOSIS — L820 Inflamed seborrheic keratosis: Secondary | ICD-10-CM | POA: Diagnosis not present

## 2018-09-08 ENCOUNTER — Telehealth: Payer: Self-pay | Admitting: *Deleted

## 2018-09-08 NOTE — Telephone Encounter (Signed)
Dr. Jannifer Franklin completed handicap parking placard on 09/08/18.  It was mailed to her home address.

## 2018-12-10 ENCOUNTER — Encounter: Payer: Self-pay | Admitting: Neurology

## 2018-12-10 ENCOUNTER — Ambulatory Visit: Payer: Medicare PPO | Admitting: Neurology

## 2018-12-10 VITALS — BP 144/80 | HR 80 | Ht 63.5 in | Wt 184.0 lb

## 2018-12-10 DIAGNOSIS — G118 Other hereditary ataxias: Secondary | ICD-10-CM | POA: Diagnosis not present

## 2018-12-10 NOTE — Progress Notes (Signed)
Reason for visit: Spinocerebellar ataxia  Jaime Mccarthy is an 78 y.o. 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  The patient has had a very slow gradual progression of her ability to ambulate.  She is falling once or twice a month, even with a walker.  She oftentimes will fall backwards landing on her buttocks but she fortunately has not sustained any significant injuries.  The patient has modified her bathroom and shower to increase safety.  She denies any issues with swallowing per se.  She uses a wheelchair when outside of the house.  She will continue to walk some with a walker inside the house.  Her speech is affected with the ataxia.  She returns for an evaluation.  Past Medical History:  Diagnosis Date  . Allergy    allergic rhinitis seasonal  . Ataxia    degenerative cerebellar  . Degenerative arthritis     Left knee  . Dyslipidemia   . Gait disorder   . Hyperlipidemia   . Hypertension   . 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 BREAST SURGERY     left breast needle biopsy negative  . GALLBLADDER SURGERY      Family History  Problem Relation Age of Onset  . COPD Mother   . Cancer Mother        breast Ca  . Heart disease Father        CAD  . Cancer Father        lung CA  . Depression Daughter   . Heart disease Maternal Grandmother        heart failure    Social history:  reports that she has quit smoking. She has never used smokeless tobacco. She reports that she does not drink alcohol or use drugs.   No Known Allergies  Medications:  Prior to Admission medications   Medication Sig Start Date End Date Taking? Authorizing Provider  acetaminophen (TYLENOL) 325 MG tablet Take 650 mg by mouth every 6 (six) hours as needed.   Yes [provider]  aspirin 325 MG tablet Take 325 mg  by mouth daily.   Yes [provider]  cholecalciferol (VITAMIN D) 1000 units tablet Take 2,000 Units by mouth daily.    Yes [provider]  Flaxseed, Linseed, (FLAX SEED OIL) 1000 MG CAPS Take 2 capsules by mouth daily.     Yes [provider]  hydrochlorothiazide (HYDRODIURIL) 25 MG tablet Take 1 tablet (25 mg total) by mouth daily. 03/25/18  Yes Tower, Wynelle Fanny, MD  metoprolol succinate (TOPROL-XL) 50 MG 24 hr tablet Take 1/2 pill by mouth once daily 03/25/18  Yes Tower, Wynelle Fanny, MD  MULTIPLE VITAMIN PO Take by mouth daily.   Yes [provider]    ROS:  Out of a complete 14 system review of symptoms, the patient complains only of the following symptoms, and all other reviewed systems are negative.  Eye redness Walking difficulty Speech difficulty, weakness  Blood pressure (!) 144/80, pulse 80, height 5' 3.5" (1.613 m), weight 184 lb (83.5 kg).  Physical Exam  General: The patient is alert and cooperative at the time of the examination.  Skin: No significant peripheral edema is noted.   Neurologic Exam  Mental status: The patient is alert and oriented x 3 at the time of  the examination. The patient has apparent normal recent and remote memory, with an apparently normal attention span and concentration ability.   Cranial nerves: Facial symmetry is present. Speech is ataxic and slightly dysarthric, not a phasic. Extraocular movements are full.  The patient appears to have end gaze nystagmus bilaterally.  Visual fields are full.  Motor: The patient has good strength in all 4 extremities.  Sensory examination: Soft touch sensation is symmetric on the face, arms, and legs.  Coordination: The patient has some dysmetria with finger-nose-finger and heel-to-shin bilaterally.  Gait and station: The patient has a wide-based, ataxic gait.  The patient can only walk with assistance.  Reflexes: Deep tendon reflexes are symmetric but are slightly  decreased.   Assessment/Plan:  1.  Spinocerebellar ataxia  2.  Gait disturbance  The patient continues to progress slowly with her ability to ambulate, she is at risk for falls.  I have suggested physical therapy and home environment, she does not wish to consider this at this time.  She is followed conservatively, she will follow-up in 1 year.  Jill Alexanders MD 12/10/2018 10:12 AM  [facility] [address] [place],  [address]  Phone [phone] Fax [phone]

## 2019-01-05 ENCOUNTER — Encounter: Payer: Self-pay | Admitting: Family Medicine

## 2019-01-05 DIAGNOSIS — Z803 Family history of malignant neoplasm of breast: Secondary | ICD-10-CM | POA: Diagnosis not present

## 2019-01-05 DIAGNOSIS — R921 Mammographic calcification found on diagnostic imaging of breast: Secondary | ICD-10-CM | POA: Diagnosis not present

## 2019-02-02 DIAGNOSIS — H5203 Hypermetropia, bilateral: Secondary | ICD-10-CM | POA: Diagnosis not present

## 2019-02-02 DIAGNOSIS — H0288B Meibomian gland dysfunction left eye, upper and lower eyelids: Secondary | ICD-10-CM | POA: Diagnosis not present

## 2019-02-02 DIAGNOSIS — H25013 Cortical age-related cataract, bilateral: Secondary | ICD-10-CM | POA: Diagnosis not present

## 2019-02-02 DIAGNOSIS — H0288A Meibomian gland dysfunction right eye, upper and lower eyelids: Secondary | ICD-10-CM | POA: Diagnosis not present

## 2019-02-02 DIAGNOSIS — H524 Presbyopia: Secondary | ICD-10-CM | POA: Diagnosis not present

## 2019-02-02 DIAGNOSIS — H2513 Age-related nuclear cataract, bilateral: Secondary | ICD-10-CM | POA: Diagnosis not present

## 2019-02-12 ENCOUNTER — Telehealth: Payer: Self-pay | Admitting: Family Medicine

## 2019-02-12 MED ORDER — METOPROLOL SUCCINATE ER 50 MG PO TB24: ORAL_TABLET | ORAL | 0 refills | Status: DC

## 2019-02-12 MED ORDER — HYDROCHLOROTHIAZIDE 25 MG PO TABS: 25.0000 mg | ORAL_TABLET | Freq: Every day | ORAL | 0 refills | Status: DC

## 2019-02-12 NOTE — Telephone Encounter (Signed)
That is just fine Please send 3 months worth of her px to her pref pharmacy  Thanks  Re schedule when able

## 2019-02-12 NOTE — Telephone Encounter (Signed)
Pt's husband dropped off letter from [place]. She has cancelled her annual wellness visit and wants to wait to reschedule at another time due to not wanting to come into the office right now. Letter placed in rx tower.

## 2019-02-12 NOTE — Telephone Encounter (Signed)
Pt's spouse notified of Dr. Marliss Coots comments and med refilled.

## 2019-02-12 NOTE — Telephone Encounter (Signed)
In your inbox.

## 2019-03-26 ENCOUNTER — Ambulatory Visit: Payer: Self-pay

## 2019-03-30 ENCOUNTER — Ambulatory Visit: Payer: Self-pay

## 2019-03-31 ENCOUNTER — Encounter: Payer: Self-pay | Admitting: Family Medicine

## 2019-06-07 ENCOUNTER — Telehealth: Payer: Self-pay | Admitting: Family Medicine

## 2019-06-07 MED ORDER — METOPROLOL SUCCINATE ER 50 MG PO TB24: ORAL_TABLET | ORAL | 1 refills | Status: DC

## 2019-06-07 MED ORDER — HYDROCHLOROTHIAZIDE 25 MG PO TABS: 25.0000 mg | ORAL_TABLET | Freq: Every day | ORAL | 1 refills | Status: DC

## 2019-06-07 NOTE — Telephone Encounter (Signed)
Pt's husband dropped off letter from patient requesting refills on Metoprolol and Hydrochlorothiazide to get her by until Physical. Placed letter in [facility] tower.

## 2019-06-07 NOTE — Telephone Encounter (Signed)
meds refilled enough to last until her CPE

## 2019-08-31 ENCOUNTER — Telehealth: Payer: Self-pay | Admitting: Family Medicine

## 2019-08-31 DIAGNOSIS — E7800 Pure hypercholesterolemia, unspecified: Secondary | ICD-10-CM

## 2019-08-31 DIAGNOSIS — I10 Essential (primary) hypertension: Secondary | ICD-10-CM

## 2019-08-31 DIAGNOSIS — E559 Vitamin D deficiency, unspecified: Secondary | ICD-10-CM

## 2019-08-31 NOTE — Telephone Encounter (Signed)
-----   Message from Ellamae Sia sent at 08/24/2019  3:49 PM EDT ----- Regarding: Lab orders for Wednesday, 9.30.20 Patient is scheduled for CPX labs, please order future labs, Thanks , Karna Christmas

## 2019-09-01 ENCOUNTER — Other Ambulatory Visit (INDEPENDENT_AMBULATORY_CARE_PROVIDER_SITE_OTHER): Payer: Medicare PPO

## 2019-09-01 ENCOUNTER — Ambulatory Visit: Payer: Medicare PPO

## 2019-09-01 DIAGNOSIS — E559 Vitamin D deficiency, unspecified: Secondary | ICD-10-CM

## 2019-09-01 DIAGNOSIS — I10 Essential (primary) hypertension: Secondary | ICD-10-CM

## 2019-09-01 DIAGNOSIS — E7800 Pure hypercholesterolemia, unspecified: Secondary | ICD-10-CM

## 2019-09-01 LAB — LIPID PANEL
Cholesterol: 240 mg/dL — ABNORMAL HIGH (ref 0–200)
HDL: 55.8 mg/dL (ref >39.00)
LDL Cholesterol: 151 mg/dL — ABNORMAL HIGH (ref 0–99)
NonHDL: 183.89
Total CHOL/HDL Ratio: 4
Triglycerides: 164 mg/dL — ABNORMAL HIGH (ref 0.0–149.0)
VLDL: 32.8 mg/dL (ref 0.0–40.0)

## 2019-09-01 LAB — COMPREHENSIVE METABOLIC PANEL
ALT: 39 U/L — ABNORMAL HIGH (ref 0–35)
AST: 29 U/L (ref 0–37)
Albumin: 4.4 g/dL (ref 3.5–5.2)
Alkaline Phosphatase: 79 U/L (ref 39–117)
BUN: 10 mg/dL (ref 6–23)
CO2: 29 mEq/L (ref 19–32)
Calcium: 10.5 mg/dL (ref 8.4–10.5)
Chloride: 100 mEq/L (ref 96–112)
Creatinine, Ser: 0.8 mg/dL (ref 0.40–1.20)
GFR: 69.77 mL/min (ref >60.00)
Glucose, Bld: 97 mg/dL (ref 70–99)
Potassium: 3.9 mEq/L (ref 3.5–5.1)
Sodium: 139 mEq/L (ref 135–145)
Total Bilirubin: 0.6 mg/dL (ref 0.2–1.2)
Total Protein: 7.1 g/dL (ref 6.0–8.3)

## 2019-09-01 LAB — CBC WITH DIFFERENTIAL/PLATELET
Basophils Absolute: 0 10*3/uL (ref 0.0–0.1)
Basophils Relative: 0.5 % (ref 0.0–3.0)
Eosinophils Absolute: 0.2 10*3/uL (ref 0.0–0.7)
Eosinophils Relative: 2.5 % (ref 0.0–5.0)
HCT: 43.7 % (ref 36.0–46.0)
Hemoglobin: 14.5 g/dL (ref 12.0–15.0)
Lymphocytes Relative: 28 % (ref 12.0–46.0)
Lymphs Abs: 2.7 10*3/uL (ref 0.7–4.0)
MCHC: 33.1 g/dL (ref 30.0–36.0)
MCV: 90 fl (ref 78.0–100.0)
Monocytes Absolute: 0.8 10*3/uL (ref 0.1–1.0)
Monocytes Relative: 8.8 % (ref 3.0–12.0)
Neutro Abs: 5.8 10*3/uL (ref 1.4–7.7)
Neutrophils Relative %: 60.2 % (ref 43.0–77.0)
Platelets: 317 10*3/uL (ref 150.0–400.0)
RBC: 4.85 Mil/uL (ref 3.87–5.11)
RDW: 15.7 % — ABNORMAL HIGH (ref 11.5–15.5)
WBC: 9.6 10*3/uL (ref 4.0–10.5)

## 2019-09-01 LAB — TSH: TSH: 4.09 u[IU]/mL (ref 0.35–4.50)

## 2019-09-01 LAB — VITAMIN D 25 HYDROXY (VIT D DEFICIENCY, FRACTURES): VITD: 42.17 ng/mL (ref 30.00–100.00)

## 2019-09-08 ENCOUNTER — Ambulatory Visit (INDEPENDENT_AMBULATORY_CARE_PROVIDER_SITE_OTHER): Payer: Medicare PPO | Admitting: Family Medicine

## 2019-09-08 ENCOUNTER — Encounter: Payer: Self-pay | Admitting: Family Medicine

## 2019-09-08 ENCOUNTER — Other Ambulatory Visit: Payer: Self-pay

## 2019-09-08 VITALS — BP 132/78 | HR 65 | Temp 97.6°F

## 2019-09-08 DIAGNOSIS — G118 Other hereditary ataxias: Secondary | ICD-10-CM | POA: Diagnosis not present

## 2019-09-08 DIAGNOSIS — Z0000 Encounter for general adult medical examination without abnormal findings: Secondary | ICD-10-CM

## 2019-09-08 DIAGNOSIS — I10 Essential (primary) hypertension: Secondary | ICD-10-CM

## 2019-09-08 DIAGNOSIS — H6123 Impacted cerumen, bilateral: Secondary | ICD-10-CM | POA: Diagnosis not present

## 2019-09-08 DIAGNOSIS — E7800 Pure hypercholesterolemia, unspecified: Secondary | ICD-10-CM

## 2019-09-08 DIAGNOSIS — E559 Vitamin D deficiency, unspecified: Secondary | ICD-10-CM | POA: Diagnosis not present

## 2019-09-08 DIAGNOSIS — Z23 Encounter for immunization: Secondary | ICD-10-CM

## 2019-09-08 DIAGNOSIS — M85859 Other specified disorders of bone density and structure, unspecified thigh: Secondary | ICD-10-CM

## 2019-09-08 MED ORDER — HYDROCHLOROTHIAZIDE 25 MG PO TABS: 25.0000 mg | ORAL_TABLET | Freq: Every day | ORAL | 3 refills | Status: DC

## 2019-09-08 MED ORDER — METOPROLOL SUCCINATE ER 50 MG PO TB24: ORAL_TABLET | ORAL | 3 refills | Status: DC

## 2019-09-08 NOTE — Assessment & Plan Note (Signed)
Vitamin D level is therapeutic with current supplementation Disc importance of this to bone and overall health Level of 42.1

## 2019-09-08 NOTE — Assessment & Plan Note (Signed)
Disc goals for lipids and reasons to control them Rev last labs with pt Rev low sat fat diet in detail LDL is high- but pt cannot take statin due to neuromuscular disease  Enc strongly to watch diet

## 2019-09-08 NOTE — Assessment & Plan Note (Signed)
dexa 5/18  Pt is prone to falls from neurologic condition  No fractures Taking vit D -level is tx Exercise is limited She prefers to wait on next dexa due to pandemic

## 2019-09-08 NOTE — Assessment & Plan Note (Signed)
bp in fair control at this time  BP Readings from Last 1 Encounters:  09/08/19 132/78   No changes needed Most recent labs reviewed  Disc lifstyle change with low sodium diet and exercise

## 2019-09-08 NOTE — Assessment & Plan Note (Signed)
This continues to progress  Sees neurology Will likely become wheelchair bound  Good support

## 2019-09-08 NOTE — Patient Instructions (Addendum)
There are some chair exercise programs on video and online  Check them out  Get moving when you can   Since you cannot take cholesterol medicine - please watch diet  Avoid red meat/ fried foods/ egg yolks/ fatty breakfast meats/ butter, cheese and high fat dairy/ and shellfish    Get your mammogram in February  Take care of yourself  Be careful not to fall   Flu shot given today

## 2019-09-08 NOTE — Assessment & Plan Note (Signed)
Reviewed health habits including diet and exercise and skin cancer prevention Reviewed appropriate screening tests for age  Also reviewed health mt list, fam hx and immunization status , as well as social and family history   See HPI Labs reviewed  Pt will schedule her mammogram in feb Flu vaccine today Rev last dexa  Pt has an advance directive  No cognitive concerns (despite physical neuro limitations)  Unable to stand to weigh but thinks she is stable  Hearing screen normal Up to date with eye exam  Enc activity as tolerated (like chair exercise) Good mood despite adversity

## 2019-09-08 NOTE — Progress Notes (Signed)
Subjective:    Patient ID: Jaime Mccarthy, female    DOB: 10/10/1941, 78 y.o.   MRN: [id number]  HPI Here for amw and health mt exam with review of chronic medical problems  I have personally reviewed the Medicare Annual Wellness questionnaire and have noted 1. The patient's medical and social history 2. Their use of alcohol, tobacco or illicit drugs 3. Their current medications and supplements 4. The patient's functional ability including ADL's, fall risks, home safety risks and hearing or visual             impairment. 5. Diet and physical activities 6. Evidence for depression or mood disorders  The patients weight, height, BMI have been recorded in the chart and visual acuity is per eye clinic.  I have made referrals, counseling and provided education to the patient based review of the above and I have provided the pt with a written personalized care plan for preventive services. Reviewed and updated provider list, see scanned forms.  See scanned forms.  Routine anticipatory guidance given to patient.  See health maintenance. Colon cancer screening  colonoscopy 12/04   ifob neg 3/15 -declines further screening  Breast cancer screening  Mammogram 2/20 -she makes her own appt  Self breast exam- no changes (had [place] for lump in the past)  Mother had breast cancer  Flu vaccine- today /high dose  Tetanus vaccine 4/16 Td Pneumovax completed Zoster vaccine -had zostavax in 2015 Dexa 5/18 -osteopenia -wants to wait a bit longer due to pandemic to have next dexa  Falls- using a walker /still falls frequently -no injuries  As careful as she can be Fractures-none Supplements-takes vitamin D D level is good at 42.1 Exercise -none /needs to do some chair exercise   Advance directive-has living will and poa  Cognitive function addressed- see scanned forms- and if abnormal then additional documentation follows.  Memory is fairly good   (speech is slow but can think faster than she can talk)  Forgets names occasionally  No confusion  Does not get lost   PMH and SH reviewed  Meds, vitals, and allergies reviewed.   ROS: See HPI.  Otherwise negative.    Weight : Cannot weigh/ wheelchair bound  Last wt 179 lb with bmi of 31.25  Hearing/vision:  Hearing Screening   125Hz  250Hz  500Hz  1000Hz  2000Hz  3000Hz  4000Hz  6000Hz  8000Hz   Right ear:   40 40 40  40    Left ear:   40 40 40  40    Vision Screening Comments: Pt had eye exam in March 2020 at [facility] (Dr. 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  She notices worse walking- uses a walker  She gets frustrated  Speech is worse as while  Has to consider a wheelchair  Dr Jannifer Franklin was going to retire/she saw a PA last time  Husband is supportive  Mood is good    bp is stable today  No cp or palpitations or headaches or edema  No side effects to medicines  BP Readings from Last 3 Encounters:  09/08/19 132/78  12/10/18 (!) 144/80  03/25/18 128/62      Hyperlipidemia Lab Results  Component Value Date   CHOL 240 (H) 09/01/2019   CHOL 242 (H) 03/24/2018   CHOL 266 (H) 03/20/2017   Lab Results  Component Value Date   HDL 55.80 09/01/2019   HDL 58.20 03/24/2018   HDL  60.20 03/20/2017   Lab Results  Component Value Date   LDLCALC 151 (H) 09/01/2019   LDLCALC 155 (H) 03/24/2018   LDLCALC 177 (H) 03/20/2017   Lab Results  Component Value Date   TRIG 164.0 (H) 09/01/2019   TRIG 141.0 03/24/2018   TRIG 144.0 03/20/2017   Lab Results  Component Value Date   CHOLHDL 4 09/01/2019   CHOLHDL 4 03/24/2018   CHOLHDL 4 03/20/2017   Lab Results  Component Value Date   LDLDIRECT 186.5 11/18/2013   LDLDIRECT 147.0 09/25/2012   LDLDIRECT 133.8 03/03/2012  cannot take statins due to her neuromuscular condition  She usually eats what she wants -just not as much   Does eat sweets /fried chicken  Not as much fried foods as the past  No  red meat   Lab Results  Component Value Date   WBC 9.6 09/01/2019   HGB 14.5 09/01/2019   HCT 43.7 09/01/2019   MCV 90.0 09/01/2019   PLT 317.0 09/01/2019    Lab Results  Component Value Date   CREATININE 0.80 09/01/2019   BUN 10 09/01/2019   NA 139 09/01/2019   K 3.9 09/01/2019   CL 100 09/01/2019   CO2 29 09/01/2019   Glucose 97  Lab Results  Component Value Date   ALT 39 (H) 09/01/2019   AST 29 09/01/2019   ALKPHOS 79 09/01/2019   BILITOT 0.6 09/01/2019    ALT goes up and down  occ tylenol  No alcohol   Lab Results  Component Value Date   TSH 4.09 09/01/2019    Patient Active Problem List   Diagnosis Date Noted  . Osteopenia 04/29/2017  . Estrogen deficiency 03/24/2017  . Routine general medical examination at a health care facility 03/19/2016  . Cerumen impaction 03/19/2016  . 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 Encounter for Medicare annual wellness exam 11/26/2013  . Colon cancer screening 11/26/2013  . 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 Abnormality of gait 01/26/2013  . Vitamin D deficiency 01/04/2009  . DISORDER, 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  . FIBROIDS, UTERUS 03/16/2007  . HYPERCHOLESTEROLEMIA 03/16/2007  . Essential hypertension 03/16/2007  . ALLERGIC RHINITIS 03/16/2007  . FIBROCYSTIC BREAST DISEASE 03/16/2007  . HEART MURMUR, HX OF 03/16/2007   Past Medical History:  Diagnosis Date  . Allergy    allergic rhinitis seasonal  . Ataxia    degenerative cerebellar  . Degenerative arthritis     Left knee  . Dyslipidemia   . Gait disorder   . Hyperlipidemia   . Hypertension   . 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 BREAST SURGERY     left breast needle biopsy negative  . GALLBLADDER SURGERY     Social History   Tobacco Use  . Smoking status: Former Research scientist (life sciences)  . Smokeless tobacco: Never Used  Substance Use Topics  . Alcohol use: No    Comment: occasional  . Drug use: No   Family  History  Problem Relation Age of Onset  . COPD Mother   . Cancer Mother        breast Ca  . Heart disease Father        CAD  . Cancer Father        lung CA  . Depression Daughter   . Heart disease Maternal Grandmother        heart failure   No Known Allergies Current Outpatient Medications on File Prior to Visit  Medication Sig Dispense  Refill  . acetaminophen (TYLENOL) 325 MG tablet Take 650 mg by mouth every 6 (six) hours as needed.    Marland Kitchen aspirin 325 MG tablet Take 325 mg by mouth daily.    . cholecalciferol (VITAMIN D) 1000 units tablet Take 2,000 Units by mouth daily.     . Flaxseed, Linseed, (FLAX SEED OIL) 1000 MG CAPS Take 2 capsules by mouth daily.      . MULTIPLE VITAMIN PO Take by mouth daily.     No current facility-administered medications on file prior to visit.     Review of Systems  Constitutional: Negative for activity change, appetite change, fatigue, fever and unexpected weight change.  HENT: Negative for congestion, ear pain, rhinorrhea, sinus pressure and sore throat.   Eyes: Negative for pain, redness and visual disturbance.  Respiratory: Negative for cough, shortness of breath and wheezing.   Cardiovascular: Negative for chest pain and palpitations.  Gastrointestinal: Negative for abdominal pain, blood in stool, constipation and diarrhea.  Endocrine: Negative for polydipsia and polyuria.  Genitourinary: Negative for dysuria, frequency and urgency.  Musculoskeletal: Negative for arthralgias, back pain and myalgias.  Skin: Negative for pallor and rash.  Allergic/Immunologic: Negative for environmental allergies.  Neurological: Positive for dizziness, speech difficulty and weakness. Negative for syncope, facial asymmetry and headaches.  Hematological: Negative for adenopathy. Does not bruise/bleed easily.  Psychiatric/Behavioral: Negative for decreased concentration and dysphoric mood. The patient is not nervous/anxious.        Objective:   Physical Exam  Constitutional:      General: She is not in acute distress.    Appearance: Normal appearance. She is well-developed. She is obese. She is not ill-appearing or diaphoretic.  HENT:     Head: Normocephalic and atraumatic.     Right Ear: Tympanic membrane, ear canal and external ear normal. There is impacted cerumen.     Left Ear: Tympanic membrane, ear canal and external ear normal. There is impacted cerumen.     Nose: Nose normal. No congestion.     Mouth/Throat:     Mouth: Mucous membranes are moist.     Pharynx: Oropharynx is clear. No posterior oropharyngeal erythema.     Comments: Baseline slow speech that is somewhat slurred  Eyes:     General: No scleral icterus.    Extraocular Movements: Extraocular movements intact.     Conjunctiva/sclera: Conjunctivae normal.     Pupils: Pupils are equal, round, and reactive to light.  Neck:     Musculoskeletal: Normal range of motion and neck supple. No neck rigidity or muscular tenderness.     Thyroid: No thyromegaly.     Vascular: No carotid bruit or JVD.  Cardiovascular:     Rate and Rhythm: Normal rate and regular rhythm.     Pulses: Normal pulses.     Heart sounds: Normal heart sounds. No gallop.   Pulmonary:     Effort: Pulmonary effort is normal. No respiratory distress.     Breath sounds: Normal breath sounds. No wheezing.     Comments: Good air exch Chest:     Chest wall: No tenderness.  Abdominal:     General: Bowel sounds are normal. There is no distension or abdominal bruit.     Palpations: Abdomen is soft. There is no mass.     Tenderness: There is no abdominal tenderness.     Hernia: No hernia is present.  Genitourinary:    Comments: Breast exam: No mass, nodules, thickening, tenderness, bulging, retraction, inflamation, nipple discharge or  skin changes noted.  No axillary or clavicular LA.    (breast exam done with pt sitting upright in wheelchair) Musculoskeletal: Normal range of motion.        General: No tenderness.      Right lower leg: No edema.     Left lower leg: No edema.     Comments: No kyphosis   Lymphadenopathy:     Cervical: No cervical adenopathy.  Skin:    General: Skin is warm and dry.     Coloration: Skin is not pale.     Findings: No erythema or rash.     Comments: Some sks and lentigines  Neurological:     Mental Status: She is alert. Mental status is at baseline.     Cranial Nerves: No cranial nerve deficit.     Motor: No abnormal muscle tone.     Coordination: Coordination normal.     Gait: Gait normal.     Deep Tendon Reflexes: Reflexes are normal and symmetric. Reflexes normal.  Psychiatric:        Mood and Affect: Mood normal.        Cognition and Memory: Cognition and memory normal.     Comments: Good sense of humor            Assessment & Plan:   Problem List Items Addressed This Visit      Cardiovascular and Mediastinum   Essential hypertension    bp in fair control at this time  BP Readings from Last 1 Encounters:  09/08/19 132/78   No changes needed Most recent labs reviewed  Disc lifstyle change with low sodium diet and exercise        Relevant Medications   metoprolol succinate (TOPROL-XL) 50 MG 24 hr tablet   hydrochlorothiazide (HYDRODIURIL) 25 MG tablet     Nervous and Auditory   Spinocerebellar ataxia (HCC) (Chronic)    This continues to progress  Sees neurology Will likely become wheelchair bound  Good support      Cerumen impaction    Partial today Hearing screen nl  She will continue to tx at home        Musculoskeletal and Integument   Osteopenia    dexa 5/18  Pt is prone to falls from neurologic condition  No fractures Taking vit D -level is tx Exercise is limited She prefers to wait on next dexa due to pandemic        Other   Vitamin D deficiency    Vitamin D level is therapeutic with current supplementation Disc importance of this to bone and overall health Level of 42.1        HYPERCHOLESTEROLEMIA    Disc  goals for lipids and reasons to control them Rev last labs with pt Rev low sat fat diet in detail LDL is high- but pt cannot take statin due to neuromuscular disease  Enc strongly to watch diet       Relevant Medications   metoprolol succinate (TOPROL-XL) 50 MG 24 hr tablet   hydrochlorothiazide (HYDRODIURIL) 25 MG tablet   Encounter for Medicare annual wellness exam - Primary    Reviewed health habits including diet and exercise and skin cancer prevention Reviewed appropriate screening tests for age  Also reviewed health mt list, fam hx and immunization status , as well as social and family history   See HPI Labs reviewed  Pt will schedule her mammogram in feb Flu vaccine today Rev last dexa  Pt has an advance directive  No cognitive concerns (despite physical neuro limitations)  Unable to stand to weigh but thinks she is stable  Hearing screen normal Up to date with eye exam  Enc activity as tolerated (like chair exercise) Good mood despite adversity         Routine general medical examination at a health care facility    Reviewed health habits including diet and exercise and skin cancer prevention Reviewed appropriate screening tests for age  Also reviewed health mt list, fam hx and immunization status , as well as social and family history   See HPI Labs reviewed  Pt will schedule her mammogram in feb Flu vaccine today Rev last dexa  Pt has an advance directive  No cognitive concerns (despite physical neuro limitations)  Unable to stand to weigh but thinks she is stable  Hearing screen normal Up to date with eye exam  Enc activity as tolerated (like chair exercise) Good mood despite adversity       Other Visit Diagnoses    Need for influenza vaccination       Relevant Orders   Flu Vaccine QUAD High Dose(Fluad) (Completed)

## 2019-09-08 NOTE — Assessment & Plan Note (Signed)
Partial today Hearing screen nl  She will continue to tx at home

## 2019-11-15 NOTE — Telephone Encounter (Signed)
error 

## 2019-12-15 NOTE — Progress Notes (Signed)
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  She has had a very slow gradual progression of her ability to ambulate.  She uses a walker, but is still prone to fall.  She continues to have more difficulty walking, she is falling about 2-3 times a month.  She says she would fall more if she did not catch herself.  She uses her walker at home, uses her wheelchair when she goes out.  She has noticed that when she is standing, her right knee feels weak, that her right leg bows, she been wearing a knee sleeve.  She also reports some urinary and bowel incontinence when she stands.  She is able to use the bathroom, and do her bathing, as she has adapted her bathroom for safety.  She is considering getting an IT trainer wheelchair or scooter.  She presents today for evaluation accompanied by her daughter.  HISTORY 12/10/2018 Dr. 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  The patient has had a very slow gradual progression of her ability to ambulate.  She is falling once or twice a month, even with a walker.  She oftentimes will fall backwards landing on her buttocks but she fortunately has not sustained any significant injuries.  The patient has modified her bathroom and shower to increase safety.  She denies any issues with swallowing per se.  She uses a wheelchair when outside of the house.  She will continue to walk some with a walker inside the house.  Her speech is affected with the ataxia.  She returns for an evaluation.  REVIEW OF SYSTEMS: Out of a complete 14 system review of symptoms, the patient complains only of the following symptoms, and all other reviewed systems are negative.  Weakness, falls   ALLERGIES: No Known Allergies  HOME MEDICATIONS: Outpatient Medications Prior to Visit  Medication Sig Dispense Refill  . acetaminophen (TYLENOL) 325 MG tablet Take 650 mg by mouth every 6 (six) hours as needed.    Marland Kitchen aspirin 325 MG tablet Take 325 mg by mouth daily.    . cholecalciferol (VITAMIN D) 1000 units tablet Take 2,000 Units by mouth daily.     . Flaxseed, Linseed, (FLAX SEED OIL) 1000 MG CAPS Take 2 capsules by mouth daily.      . hydrochlorothiazide (HYDRODIURIL) 25 MG tablet Take 1 tablet (25 mg total) by mouth daily. 90 tablet 3  . metoprolol succinate (TOPROL-XL) 50 MG 24 hr tablet Take 1/2 pill by mouth once daily 45 tablet 3  . MULTIPLE VITAMIN PO Take by mouth daily.     No facility-administered medications prior to visit.    PAST MEDICAL HISTORY: Past Medical History:  Diagnosis Date  . Allergy    allergic rhinitis seasonal  . Ataxia    degenerative cerebellar  . Degenerative arthritis     Left knee  . Dyslipidemia   . Gait disorder   . Hyperlipidemia   . Hypertension   . 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 BREAST SURGERY     left breast needle biopsy negative  . GALLBLADDER SURGERY      FAMILY HISTORY: Family History  Problem Relation Age  of Onset  . COPD Mother   . Cancer Mother        breast Ca  . Heart disease Father        CAD  . Cancer Father        lung CA  . Depression Daughter   . Heart disease Maternal Grandmother        heart failure    SOCIAL HISTORY: Social History   Socioeconomic History  . Marital status: Married    Spouse name: Not on file  . Number of children: 1  . Years of education: 35  . Highest education level: Not on file  Occupational History    Employer: [employer]  Tobacco Use  . Smoking status: Former Research scientist (life sciences)  . Smokeless tobacco: Never Used  Substance and Sexual Activity  . Alcohol use: No    Comment: occasional  . Drug  use: No  . Sexual activity: Not on file  Other Topics Concern  . Not on file  Social History Narrative   Patient drinks about 2 cups of caffeine daily.   Patient is right handed.    Social Determinants of Health   Financial Resource Strain:   . Difficulty of Paying Living Expenses: Not on file  Food Insecurity:   . Worried About Charity fundraiser in the Last Year: Not on file  . Ran Out of Food in the Last Year: Not on file  Transportation Needs:   . Lack of Transportation (Medical): Not on file  . Lack of Transportation (Non-Medical): Not on file  Physical Activity:   . Days of Exercise per Week: Not on file  . Minutes of Exercise per Session: Not on file  Stress:   . Feeling of Stress : Not on file  Social Connections:   . Frequency of Communication with Friends and Family: Not on file  . Frequency of Social Gatherings with Friends and Family: Not on file  . Attends Religious Services: Not on file  . Active Member of Clubs or Organizations: Not on file  . Attends Archivist Meetings: Not on file  . Marital Status: Not on file  Intimate Partner Violence:   . Fear of Current or Ex-Partner: Not on file  . Emotionally Abused: Not on file  . Physically Abused: Not on file  . Sexually Abused: Not on file   PHYSICAL EXAM  Vitals:   12/16/19 1032  BP: 136/66  Pulse: 66  Temp: (!) 95.6 F (35.3 C)  Weight: 179 lb (81.2 kg)  Height: 5\' 4"  (1.626 m)   Body mass index is 30.73 kg/m.  Generalized: Well developed, in no acute distress   Neurological examination  Mentation: Alert oriented to time, place, history taking. Follows all commands speech, ataxia with speech Cranial nerve II-XII: Pupils were equal round reactive to light. Extraocular movements were full, visual field were full on confrontational test. Facial sensation and strength were normal.  Head turning and shoulder shrug  were normal and symmetric. Motor: Good strength of all extremities. Sensory:  Sensory testing is intact to soft touch on all 4 extremities. No evidence of extinction is noted.  Coordination: Cerebellar testing reveals good finger-nose-finger and heel-to-shin bilaterally.  Gait and station: She is able to ambulate very short distance with 2 person assist, when standing, right leg bows back at the knee, gait is wide-based, ataxic Reflexes: Deep tendon reflexes are symmetric   DIAGNOSTIC DATA (LABS, IMAGING, TESTING) - I reviewed patient records, labs, notes, testing and  imaging myself where available.  Lab Results  Component Value Date   WBC 9.6 09/01/2019   HGB 14.5 09/01/2019   HCT 43.7 09/01/2019   MCV 90.0 09/01/2019   PLT 317.0 09/01/2019      Component Value Date/Time   NA 139 09/01/2019 0905   K 3.9 09/01/2019 0905   CL 100 09/01/2019 0905   CO2 29 09/01/2019 0905   GLUCOSE 97 09/01/2019 0905   BUN 10 09/01/2019 0905   CREATININE 0.80 09/01/2019 0905   CALCIUM 10.5 09/01/2019 0905   PROT 7.1 09/01/2019 0905   ALBUMIN 4.4 09/01/2019 0905   AST 29 09/01/2019 0905   ALT 39 (H) 09/01/2019 0905   ALKPHOS 79 09/01/2019 0905   BILITOT 0.6 09/01/2019 0905   GFRNONAA 73.95 08/28/2010 0947   GFRAA 109 06/02/2007 0856   Lab Results  Component Value Date   CHOL 240 (H) 09/01/2019   HDL 55.80 09/01/2019   LDLCALC 151 (H) 09/01/2019   LDLDIRECT 186.5 11/18/2013   TRIG 164.0 (H) 09/01/2019   CHOLHDL 4 09/01/2019   No results found for: HGBA1C Lab Results  Component Value Date   VITAMINB12 348 03/02/2013   Lab Results  Component Value Date   TSH 4.09 09/01/2019   ASSESSMENT AND PLAN 79 y.o. 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 here with:  1.  Spinocerebellar ataxia 2.  Gait disturbance  She does not wish to pursue physical therapy in the home at this time.  She is considering getting a scooter or  electric wheelchair, when she makes a decision, I will assist with the order.  She has noted bowing of the right leg when standing, weakness to the knee. She may require knee braces, she will follow-up with her sports medicine doctor she has seen in the past.  She describes some urinary and bowel incontinence when standing, she may have some pelvic floor dysfunction, I suggest she start with her primary doctor to discuss this, may require some pelvic floor strengthening training.  She will follow-up in 1 year or sooner if needed.  I did advise if her symptoms worsen or she develops any new symptoms she should let us know.   I spent 15 minutes with the patient. 50% of this time was spent discussing her plan of care.   Butler Denmark, AGNP-C, DNP 12/16/2019, 12:21 PM [facility] [address], [place], [place] [address] [phone]

## 2019-12-16 ENCOUNTER — Ambulatory Visit: Payer: Medicare PPO | Admitting: Neurology

## 2019-12-16 ENCOUNTER — Encounter: Payer: Self-pay | Admitting: Neurology

## 2019-12-16 ENCOUNTER — Other Ambulatory Visit: Payer: Self-pay

## 2019-12-16 VITALS — BP 136/66 | HR 66 | Temp 95.6°F | Ht 64.0 in | Wt 179.0 lb

## 2019-12-16 DIAGNOSIS — G118 Other hereditary ataxias: Secondary | ICD-10-CM

## 2019-12-16 DIAGNOSIS — R269 Unspecified abnormalities of gait and mobility: Secondary | ICD-10-CM

## 2019-12-16 NOTE — Progress Notes (Signed)
I have read the note, and I agree with the clinical assessment and plan.  Gizelle Whetsel K Scarleth Brame   

## 2019-12-16 NOTE — Patient Instructions (Signed)
We will hold off on physical therapy per your request   Please consider seeing your sports medicine doctor about your knee   Let me know about scooter or electric wheelchair   Return in 1 year

## 2020-01-12 ENCOUNTER — Encounter: Payer: Self-pay | Admitting: Family Medicine

## 2020-01-12 DIAGNOSIS — Z1231 Encounter for screening mammogram for malignant neoplasm of breast: Secondary | ICD-10-CM | POA: Diagnosis not present

## 2020-06-13 DIAGNOSIS — H2513 Age-related nuclear cataract, bilateral: Secondary | ICD-10-CM | POA: Diagnosis not present

## 2020-06-13 DIAGNOSIS — H524 Presbyopia: Secondary | ICD-10-CM | POA: Diagnosis not present

## 2020-06-13 DIAGNOSIS — H0288A Meibomian gland dysfunction right eye, upper and lower eyelids: Secondary | ICD-10-CM | POA: Diagnosis not present

## 2020-06-13 DIAGNOSIS — H5203 Hypermetropia, bilateral: Secondary | ICD-10-CM | POA: Diagnosis not present

## 2020-06-13 DIAGNOSIS — H0288B Meibomian gland dysfunction left eye, upper and lower eyelids: Secondary | ICD-10-CM | POA: Diagnosis not present

## 2020-06-13 DIAGNOSIS — H25013 Cortical age-related cataract, bilateral: Secondary | ICD-10-CM | POA: Diagnosis not present

## 2020-09-08 ENCOUNTER — Other Ambulatory Visit (INDEPENDENT_AMBULATORY_CARE_PROVIDER_SITE_OTHER): Payer: Medicare PPO

## 2020-09-08 ENCOUNTER — Ambulatory Visit: Payer: Medicare PPO

## 2020-09-08 ENCOUNTER — Other Ambulatory Visit: Payer: Self-pay

## 2020-09-08 ENCOUNTER — Other Ambulatory Visit: Payer: Medicare PPO

## 2020-09-08 ENCOUNTER — Telehealth (INDEPENDENT_AMBULATORY_CARE_PROVIDER_SITE_OTHER): Payer: Medicare PPO | Admitting: Family Medicine

## 2020-09-08 DIAGNOSIS — I10 Essential (primary) hypertension: Secondary | ICD-10-CM

## 2020-09-08 DIAGNOSIS — E7800 Pure hypercholesterolemia, unspecified: Secondary | ICD-10-CM

## 2020-09-08 DIAGNOSIS — E559 Vitamin D deficiency, unspecified: Secondary | ICD-10-CM

## 2020-09-08 LAB — CBC WITH DIFFERENTIAL/PLATELET
Basophils Absolute: 0 10*3/uL (ref 0.0–0.1)
Basophils Relative: 0.4 % (ref 0.0–3.0)
Eosinophils Absolute: 0.1 10*3/uL (ref 0.0–0.7)
Eosinophils Relative: 1.5 % (ref 0.0–5.0)
HCT: 42.8 % (ref 36.0–46.0)
Hemoglobin: 14.3 g/dL (ref 12.0–15.0)
Lymphocytes Relative: 33.2 % (ref 12.0–46.0)
Lymphs Abs: 2.6 10*3/uL (ref 0.7–4.0)
MCHC: 33.5 g/dL (ref 30.0–36.0)
MCV: 89.4 fl (ref 78.0–100.0)
Monocytes Absolute: 0.6 10*3/uL (ref 0.1–1.0)
Monocytes Relative: 8.3 % (ref 3.0–12.0)
Neutro Abs: 4.4 10*3/uL (ref 1.4–7.7)
Neutrophils Relative %: 56.6 % (ref 43.0–77.0)
Platelets: 307 10*3/uL (ref 150.0–400.0)
RBC: 4.79 Mil/uL (ref 3.87–5.11)
RDW: 14.6 % (ref 11.5–15.5)
WBC: 7.7 10*3/uL (ref 4.0–10.5)

## 2020-09-08 LAB — LIPID PANEL
Cholesterol: 274 mg/dL — ABNORMAL HIGH (ref 0–200)
HDL: 60.8 mg/dL (ref >39.00)
LDL Cholesterol: 187 mg/dL — ABNORMAL HIGH (ref 0–99)
NonHDL: 213.04
Total CHOL/HDL Ratio: 5
Triglycerides: 129 mg/dL (ref 0.0–149.0)
VLDL: 25.8 mg/dL (ref 0.0–40.0)

## 2020-09-08 LAB — COMPREHENSIVE METABOLIC PANEL
ALT: 24 U/L (ref 0–35)
AST: 21 U/L (ref 0–37)
Albumin: 4.3 g/dL (ref 3.5–5.2)
Alkaline Phosphatase: 85 U/L (ref 39–117)
BUN: 13 mg/dL (ref 6–23)
CO2: 28 mEq/L (ref 19–32)
Calcium: 10.3 mg/dL (ref 8.4–10.5)
Chloride: 102 mEq/L (ref 96–112)
Creatinine, Ser: 0.81 mg/dL (ref 0.40–1.20)
GFR: 70.13 mL/min (ref >60.00)
Glucose, Bld: 98 mg/dL (ref 70–99)
Potassium: 3.6 mEq/L (ref 3.5–5.1)
Sodium: 140 mEq/L (ref 135–145)
Total Bilirubin: 0.7 mg/dL (ref 0.2–1.2)
Total Protein: 7.1 g/dL (ref 6.0–8.3)

## 2020-09-08 LAB — VITAMIN D 25 HYDROXY (VIT D DEFICIENCY, FRACTURES): VITD: 49.91 ng/mL (ref 30.00–100.00)

## 2020-09-08 LAB — TSH: TSH: 5.06 u[IU]/mL — ABNORMAL HIGH (ref 0.35–4.50)

## 2020-09-08 NOTE — Telephone Encounter (Signed)
-----   Message from Tammi Sou, [place] sent at 09/08/2020  9:07 AM EDT -----  ----- Message ----- From: Camillia Herter Sent: 09/08/2020   9:01 AM EDT To: Tammi Sou, CMA  Patient needs labs ordered.

## 2020-09-08 NOTE — Progress Notes (Deleted)
Subjective:   Jaime Mccarthy is a 79 y.o. female who presents for Medicare Annual (Subsequent) preventive examination.  Review of Systems    ***       Objective:    There were no vitals filed for this visit. There is no height or weight on file to calculate BMI.  Advanced Directives 03/20/2017 12/05/2015 10/25/2014  Does Patient Have a Medical Advance Directive? Yes Yes No  Type of Paramedic of [place];Living will [place];Living will -  Copy of [place] in Chart? No - copy requested - -    Current Medications (verified) Outpatient Encounter Medications as of 09/08/2020  Medication Sig  . acetaminophen (TYLENOL) 325 MG tablet Take 650 mg by mouth every 6 (six) hours as needed.  Marland Kitchen aspirin 325 MG tablet Take 325 mg by mouth daily.  . cholecalciferol (VITAMIN D) 1000 units tablet Take 2,000 Units by mouth daily.   . Flaxseed, Linseed, (FLAX SEED OIL) 1000 MG CAPS Take 2 capsules by mouth daily.    . hydrochlorothiazide (HYDRODIURIL) 25 MG tablet Take 1 tablet (25 mg total) by mouth daily.  . metoprolol succinate (TOPROL-XL) 50 MG 24 hr tablet Take 1/2 pill by mouth once daily  . MULTIPLE VITAMIN PO Take by mouth daily.   No facility-administered encounter medications on file as of 09/08/2020.    Allergies (verified) Patient has no known allergies.   History: Past Medical History:  Diagnosis Date  . Allergy    allergic rhinitis seasonal  . Ataxia    degenerative cerebellar  . Degenerative arthritis     Left knee  . Dyslipidemia   . Gait disorder   . Hyperlipidemia   . Hypertension   . 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 BREAST SURGERY     left breast needle biopsy negative  . GALLBLADDER SURGERY     Family History  Problem Relation Age of Onset  . COPD Mother   . Cancer Mother        breast Ca  . Heart disease Father        CAD  . Cancer Father         lung CA  . Depression Daughter   . Heart disease Maternal Grandmother        heart failure   Social History   Socioeconomic History  . Marital status: Married    Spouse name: Not on file  . Number of children: 1  . Years of education: 18  . Highest education level: Not on file  Occupational History    Employer: [employer]  Tobacco Use  . Smoking status: Former Research scientist (life sciences)  . Smokeless tobacco: Never Used  Vaping Use  . Vaping Use: Never used  Substance and Sexual Activity  . Alcohol use: No    Comment: occasional  . Drug use: No  . Sexual activity: Not on file  Other Topics Concern  . Not on file  Social History Narrative   Patient drinks about 2 cups of caffeine daily.   Patient is right handed.    Social Determinants of Health   Financial Resource Strain:   . Difficulty of Paying Living Expenses: Not on file  Food Insecurity:   . Worried About Charity fundraiser in the Last Year: Not on file  . Ran Out of Food in the Last Year: Not on file  Transportation Needs:   . Lack of  Transportation (Medical): Not on file  . Lack of Transportation (Non-Medical): Not on file  Physical Activity:   . Days of Exercise per Week: Not on file  . Minutes of Exercise per Session: Not on file  Stress:   . Feeling of Stress : Not on file  Social Connections:   . Frequency of Communication with Friends and Family: Not on file  . Frequency of Social Gatherings with Friends and Family: Not on file  . Attends Religious Services: Not on file  . Active Member of Clubs or Organizations: Not on file  . Attends Archivist Meetings: Not on file  . Marital Status: Not on file    Tobacco Counseling Counseling given: Not Answered   Clinical Intake:                 Diabetic?***         Activities of Daily Living No flowsheet data found.  Patient Care Team: Tower, Wynelle Fanny, MD as PCP - General Thelma Comp, [place] as Consulting Physician  (Optometry)  Indicate any recent Medical Services you may have received from other than [facility] providers in the past year (date may be approximate).     Assessment:   This is a routine wellness examination for [place].  Hearing/Vision screen No exam data present  Dietary issues and exercise activities discussed:    Goals    . Follow up with Primary Care Provider     Starting 03/24/2018, I will continue to take medications as prescribed and to keep appointments with PCP as scheduled.     . health management     Starting 03/20/17, I will continue to take medications as prescribed and to maintain a positive outlook on life.       Depression Screen PHQ 2/9 Scores 09/08/2019 03/24/2018 03/20/2017 03/19/2016 03/13/2015 11/26/2013  PHQ - 2 Score 0 0 0 0 0 0  PHQ- 9 Score - 0 - - - -    Fall Risk Fall Risk  09/08/2019 03/24/2018 03/20/2017 03/19/2016 03/13/2015  Falls in the past year? 1 Yes Yes Yes Yes  Comment - multiple falls; 1-2 mth due to loss of balance; denies injury - - -  Number falls in past yr: 1 2 or more 2 or more 2 or more 2 or more  Injury with Fall? 0 No No No No  Risk Factor Category  - High Fall Risk - High Fall Risk High Fall Risk  Risk for fall due to : - History of fall(s);Impaired balance/gait;Impaired mobility;Impaired vision Impaired balance/gait;Impaired mobility - -  Follow up Falls evaluation completed - - - -    Any stairs in or around the home? {YES/NO:21197} If so, are there any without handrails? {YES/NO:21197} Home free of loose throw rugs in walkways, pet beds, electrical cords, etc? {YES/NO:21197} Adequate lighting in your home to reduce risk of falls? {YES/NO:21197}  ASSISTIVE DEVICES UTILIZED TO PREVENT FALLS:  Life alert? {YES/NO:21197} Use of a cane, walker or w/c? {YES/NO:21197} Grab bars in the bathroom? {YES/NO:21197} Shower chair or bench in shower? {YES/NO:21197} Elevated toilet seat or a handicapped toilet? {YES/NO:21197}  TIMED UP AND  GO:  Was the test performed? {YES/NO:21197}.  Length of time to ambulate 10 feet: *** sec.   {Appearance of BZJI:9678938}  Cognitive Function: MMSE - Mini Mental State Exam 03/24/2018 03/20/2017  Orientation to time 5 5  Orientation to Place 5 5  Registration 3 3  Attention/ Calculation 0 0  Recall 3 3  Language- name  2 objects 0 0  Language- repeat 1 1  Language- follow 3 step command 3 3  Language- read & follow direction 0 0  Write a sentence 0 0  Copy design 0 0  Total score 20 20        Immunizations Immunization History  Administered Date(s) Administered  . Fluad Quad(high Dose 65+) 09/08/2019  . Influenza Split 12/09/2012  . Influenza,inj,Quad PF,6+ Mos 11/26/2013  . Influenza-Unspecified 09/28/2015  . Pneumococcal Conjugate-13 03/13/2015  . Pneumococcal Polysaccharide-23 11/18/2012  . Td 03/21/2005, 03/13/2015  . Zoster 09/05/2014    {TDAP status:2101805} {Flu Vaccine status:2101806} {Pneumococcal vaccine status:2101807} {Covid-19 vaccine status:2101808}  Qualifies for Shingles Vaccine? {YES/NO:21197}  Zostavax completed {YES/NO:21197}  {Shingrix Completed?:2101804}  Screening Tests Health Maintenance  Topic Date Due  . Hepatitis C Screening  Never done  . COVID-19 Vaccine (1) Never done  . INFLUENZA VACCINE  07/02/2020  . MAMMOGRAM  01/11/2021  . TETANUS/TDAP  03/12/2025  . DEXA SCAN  Completed  . PNA vac Low Risk Adult  Completed    Health Maintenance  Health Maintenance Due  Topic Date Due  . Hepatitis C Screening  Never done  . COVID-19 Vaccine (1) Never done  . INFLUENZA VACCINE  07/02/2020    {Colorectal cancer screening:2101809} {Mammogram status:21018020} {Bone Density status:21018021}  Lung Cancer Screening: (Low Dose CT Chest recommended if Age 51-80 years, 30 pack-year currently smoking OR have quit w/in 15years.) {DOES NOT does:27190::"does not"} qualify.   Lung Cancer Screening Referral: ***  Additional  Screening:  Hepatitis C Screening: {DOES NOT does:27190::"does not"} qualify; Completed ***  Vision Screening: Recommended annual ophthalmology exams for early detection of glaucoma and other disorders of the eye. Is the patient up to date with their annual eye exam?  {YES/NO:21197} Who is the provider or what is the name of the office in which the patient attends annual eye exams? *** If pt is not established with a provider, would they like to be referred to a provider to establish care? {YES/NO:21197}.   Dental Screening: Recommended annual dental exams for proper oral hygiene  Community Resource Referral / Chronic Care Management: CRR required this visit?  {YES/NO:21197}  CCM required this visit?  {YES/NO:21197}     Plan:     I have personally reviewed and noted the following in the patient's chart:   . Medical and social history . Use of alcohol, tobacco or illicit drugs  . Current medications and supplements . Functional ability and status . Nutritional status . Physical activity . Advanced directives . List of other physicians . Hospitalizations, surgeries, and ER visits in previous 12 months . Vitals . Screenings to include cognitive, depression, and falls . Referrals and appointments  In addition, I have reviewed and discussed with patient certain preventive protocols, quality metrics, and best practice recommendations. A written personalized care plan for preventive services as well as general preventive health recommendations were provided to patient.     Andrez Grime, LPN   [id number]   Nurse Notes: ***

## 2020-09-15 ENCOUNTER — Ambulatory Visit (INDEPENDENT_AMBULATORY_CARE_PROVIDER_SITE_OTHER): Payer: Medicare PPO | Admitting: Family Medicine

## 2020-09-15 ENCOUNTER — Other Ambulatory Visit: Payer: Self-pay

## 2020-09-15 ENCOUNTER — Encounter: Payer: Self-pay | Admitting: Family Medicine

## 2020-09-15 VITALS — BP 136/86 | HR 55 | Temp 97.1°F

## 2020-09-15 DIAGNOSIS — E7800 Pure hypercholesterolemia, unspecified: Secondary | ICD-10-CM

## 2020-09-15 DIAGNOSIS — Z0000 Encounter for general adult medical examination without abnormal findings: Secondary | ICD-10-CM

## 2020-09-15 DIAGNOSIS — I10 Essential (primary) hypertension: Secondary | ICD-10-CM | POA: Diagnosis not present

## 2020-09-15 DIAGNOSIS — G118 Other hereditary ataxias: Secondary | ICD-10-CM

## 2020-09-15 DIAGNOSIS — E559 Vitamin D deficiency, unspecified: Secondary | ICD-10-CM | POA: Diagnosis not present

## 2020-09-15 DIAGNOSIS — Z23 Encounter for immunization: Secondary | ICD-10-CM

## 2020-09-15 DIAGNOSIS — E2839 Other primary ovarian failure: Secondary | ICD-10-CM

## 2020-09-15 DIAGNOSIS — M85859 Other specified disorders of bone density and structure, unspecified thigh: Secondary | ICD-10-CM | POA: Diagnosis not present

## 2020-09-15 NOTE — Progress Notes (Signed)
Subjective:    Patient ID: AYRABELLA LABOMBARD, female    DOB: 03-26-1941, 79 y.o.   MRN: [id number]  This visit occurred during the SARS-CoV-2 public health emergency.  Safety protocols were in place, including screening questions prior to the visit, additional usage of staff PPE, and extensive cleaning of exam room while observing appropriate contact time as indicated for disinfecting solutions.    HPI I have personally reviewed the Medicare Annual Wellness questionnaire and have noted 1. The patient's medical and social history 2. Their use of alcohol, tobacco or illicit drugs 3. Their current medications and supplements 4. The patient's functional ability including ADL's, fall risks, home safety risks and hearing or visual             impairment. 5. Diet and physical activities 6. Evidence for depression or mood disorders  The patients weight, height, BMI have been recorded in the chart and visual acuity is per eye clinic.  I have made referrals, counseling and provided education to the patient based review of the above and I have provided the pt with a written personalized care plan for preventive services. Reviewed and updated provider list, see scanned forms.  See scanned forms.  Routine anticipatory guidance given to patient.  See health maintenance. Colon cancer screening  Colonoscopy 12/04 Breast cancer screening   Mammogram 2/21 Self breast exam-no lumps  covid status - had pfizer vaccines in jan-planning on booster / [facility]  Flu vaccine-goday Tetanus vaccine 4/16 Td Pneumovax  completed Zoster vaccine   zostavax 2015 Dexa 5/18 osteopenia  Falls- very apt to fall with cerebellar ataxia Fractures-none  Supplements-vitamin D  D level is 49.9  Exercise - none due to cerebellar ataxia  Advance directive- utd  Cognitive function addressed- see scanned forms- and if abnormal then additional documentation follows.  No problems with memory or cognition despite her slow speech     PMH and SH reviewed  Meds, vitals, and allergies reviewed.   ROS: See HPI.  Otherwise negative.    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   HTN  bp is stable today  Taking hctz 25 mg  Metoprolol xl 50 mg   No cp or palpitations or headaches or edema  No side effects to medicines  BP Readings from Last 3 Encounters:  09/15/20 136/86  12/16/19 136/66  09/08/19 132/78     Pulse Readings from Last 3 Encounters:  09/15/20 (!) 55  12/16/19 66  09/08/19 65   TSH is up a little bit  Lab Results  Component Value Date   TSH 5.06 (H) 09/08/2020    Hair may be a little thinner     Lab Results  Component Value Date   CREATININE 0.81 09/08/2020   BUN 13 09/08/2020   NA 140 09/08/2020   K 3.6 09/08/2020  CL 102 09/08/2020   CO2 28 09/08/2020   Hyperlipidemia  Lab Results  Component Value Date   CHOL 274 (H) 09/08/2020   CHOL 240 (H) 09/01/2019   CHOL 242 (H) 03/24/2018   Lab Results  Component Value Date   HDL 60.80 09/08/2020   HDL 55.80 09/01/2019   HDL 58.20 03/24/2018   Lab Results  Component Value Date   LDLCALC 187 (H) 09/08/2020   LDLCALC 151 (H) 09/01/2019   LDLCALC 155 (H) 03/24/2018   Lab Results  Component Value Date   TRIG 129.0 09/08/2020   TRIG 164.0 (H) 09/01/2019   TRIG 141.0 03/24/2018   Lab Results  Component Value Date   CHOLHDL 5 09/08/2020   CHOLHDL 4  09/01/2019   CHOLHDL 4 03/24/2018   Lab Results  Component Value Date   LDLDIRECT 186.5 11/18/2013   LDLDIRECT 147.0 09/25/2012   LDLDIRECT 133.8 03/03/2012  cannot take statin due to neuromuscular dz  Water and fruit bar for breakfast  Too much fried food and fries   Had some shots in knees -cortisone an dit helped   Skin issue on arm   Patient Active Problem List   Diagnosis Date Noted  . Osteopenia 04/29/2017  . Estrogen deficiency 03/24/2017  . Routine general medical examination at a health care facility 03/19/2016  . Cerumen impaction 03/19/2016  . 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 Encounter for Medicare annual wellness exam 11/26/2013  . Colon cancer screening 11/26/2013  . 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 Abnormality of gait 01/26/2013  . Vitamin D deficiency 01/04/2009  . DISORDER, 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  . FIBROIDS, UTERUS 03/16/2007  . HYPERCHOLESTEROLEMIA 03/16/2007  . Essential hypertension 03/16/2007  . ALLERGIC RHINITIS 03/16/2007  . FIBROCYSTIC BREAST DISEASE 03/16/2007  . HEART MURMUR, HX OF 03/16/2007   Past Medical History:  Diagnosis Date  . Allergy    allergic rhinitis seasonal  . Ataxia    degenerative cerebellar  . Degenerative arthritis     Left knee  . Dyslipidemia   . Gait disorder   . Hyperlipidemia   . Hypertension   . 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 BREAST SURGERY     left breast needle biopsy negative  . GALLBLADDER SURGERY     Social History   Tobacco Use  . Smoking status: Former Research scientist (life sciences)  . Smokeless tobacco: Never Used  Vaping Use  . Vaping Use: Never used  Substance Use Topics  . Alcohol use: No    Comment: occasional  . Drug use: No   Family History  Problem Relation Age of Onset  . COPD Mother   . Cancer Mother        breast Ca  . Heart disease Father        CAD  . Cancer Father        lung CA  . Depression Daughter   . Heart disease  Maternal Grandmother        heart failure   No Known Allergies Current Outpatient Medications on File Prior to Visit  Medication Sig Dispense Refill  . aspirin 325 MG tablet Take 325 mg by mouth 4 (four) times a week.     . cholecalciferol (VITAMIN D) 1000 units tablet Take 1,000 Units by mouth daily.     . diphenhydrAMINE HCl ([facility] ALLERGY PO) Take 1 tablet by mouth daily as needed.    . Flaxseed, Linseed, (FLAX SEED OIL) 1000 MG CAPS Take 1 capsule  by mouth daily.     . hydrochlorothiazide (HYDRODIURIL) 25 MG tablet Take 1 tablet (25 mg total) by mouth daily. 90 tablet 3  . metoprolol succinate (TOPROL-XL) 50 MG 24 hr tablet Take 1/2 pill by mouth once daily 45 tablet 3  . MULTIPLE VITAMIN PO Take by mouth daily.     No current facility-administered medications on file prior to visit.    Review of Systems  Constitutional: Negative for activity change, appetite change, fatigue, fever and unexpected weight change.  HENT: Negative for congestion, ear pain, rhinorrhea, sinus pressure and sore throat.   Eyes: Negative for pain, redness and visual disturbance.  Respiratory: Negative for cough, shortness of breath and wheezing.   Cardiovascular: Negative for chest pain and palpitations.  Gastrointestinal: Negative for abdominal pain, blood in stool, constipation and diarrhea.  Endocrine: Negative for polydipsia and polyuria.  Genitourinary: Negative for dysuria, frequency and urgency.  Musculoskeletal: Negative for arthralgias, back pain and myalgias.  Skin: Positive for rash. Negative for pallor.       Rash on L wrist   Allergic/Immunologic: Negative for environmental allergies.  Neurological: Negative for dizziness, syncope and headaches.  Hematological: Negative for adenopathy. Does not bruise/bleed easily.  Psychiatric/Behavioral: Negative for decreased concentration and dysphoric mood. The patient is not nervous/anxious.        Objective:   Physical Exam Constitutional:       General: She is not in acute distress.    Appearance: Normal appearance. She is well-developed. She is obese. She is not ill-appearing or diaphoretic.  HENT:     Head: Normocephalic and atraumatic.     Right Ear: Tympanic membrane, ear canal and external ear normal.     Left Ear: Tympanic membrane, ear canal and external ear normal.     Ears:     Comments: Partial cerumen impaction bilaterally     Nose: Nose normal. No congestion.     Mouth/Throat:     Mouth: Mucous membranes are moist.     Pharynx: Oropharynx is clear. No posterior oropharyngeal erythema.  Eyes:     General: No scleral icterus.    Extraocular Movements: Extraocular movements intact.     Conjunctiva/sclera: Conjunctivae normal.     Pupils: Pupils are equal, round, and reactive to light.  Neck:     Thyroid: No thyromegaly.     Vascular: No carotid bruit or JVD.  Cardiovascular:     Rate and Rhythm: Normal rate and regular rhythm.     Pulses: Normal pulses.     Heart sounds: Normal heart sounds. No gallop.   Pulmonary:     Effort: Pulmonary effort is normal. No respiratory distress.     Breath sounds: Normal breath sounds. No wheezing.     Comments: Good air exch Chest:     Chest wall: No tenderness.  Abdominal:     General: Bowel sounds are normal. There is no distension or abdominal bruit.     Palpations: Abdomen is soft. There is no mass.     Tenderness: There is no abdominal tenderness.     Hernia: No hernia is present.  Genitourinary:    Comments: Breast exam: No mass, nodules, thickening, tenderness, bulging, retraction, inflamation, nipple discharge or skin changes noted.  No axillary or clavicular LA.     Exam done sitting in wheelchair Musculoskeletal:        General: No tenderness. Normal range of motion.     Cervical back: Normal range of motion and neck supple. No rigidity.  No muscular tenderness.     Right lower leg: No edema.     Left lower leg: No edema.  Lymphadenopathy:     Cervical: No  cervical adenopathy.  Skin:    General: Skin is warm and dry.     Coloration: Skin is not pale.     Findings: Erythema present. No rash.     Comments: Dermatitis L wrist near watch  Fair complexion  Solar lentigines diffusely   Neurological:     Mental Status: She is alert. Mental status is at baseline.     Cranial Nerves: No cranial nerve deficit.     Motor: No abnormal muscle tone.     Coordination: Coordination normal.     Gait: Gait normal.     Deep Tendon Reflexes: Reflexes are normal and symmetric. Reflexes normal.     Comments: Poor balance (seen in wheelchair)  Slow speech/baseline   Psychiatric:        Mood and Affect: Mood normal.        Cognition and Memory: Cognition and memory normal.           Assessment & Plan:   Problem List Items Addressed This Visit      Cardiovascular and Mediastinum   Essential hypertension    bp in fair control at this time  BP Readings from Last 1 Encounters:  09/15/20 136/86   No changes needed Will continue hctz 25 mg and metoprolol xl 50 mg daily   Most recent labs reviewed  Disc lifstyle change with low sodium diet and exercise          Nervous and Auditory   Spinocerebellar ataxia (HCC) (Chronic)    Progressive  Continues neurologic care  High fall risk /req more assistance         Musculoskeletal and Integument   Osteopenia    Due for dexa-ordered High fall risk No fx Taking vit D and level is therapeutic        Other   Vitamin D deficiency    Vitamin D level is therapeutic with current supplementation Disc importance of this to bone and overall health Level of 10       HYPERCHOLESTEROLEMIA    Disc goals for lipids and reasons to control them Rev last labs with pt Rev low sat fat diet in detail LDL up to 187 -poor diet lately  Cannot have a statin due to her neuromuscular dz        Encounter for Medicare annual wellness exam - Primary    Reviewed health habits including diet and exercise and  skin cancer prevention Reviewed appropriate screening tests for age  Also reviewed health mt list, fam hx and immunization status , as well as social and family history   See HPI Labs reviewed  Encouraged to get her covid booster vaccine Flu shot given today  Interested in shingrix vaccines  dexa ordered  With cerebellar ataxia very high fall risk. No fx and D level is tx  Advance directive is utd No cognitive concerns Hearing screen rev-pt declines audiology eval  Vision/eye exam is utd      Relevant Orders   Flu Vaccine QUAD High Dose(Fluad) (Completed)   Routine general medical examination at a health care facility    Reviewed health habits including diet and exercise and skin cancer prevention Reviewed appropriate screening tests for age  Also reviewed health mt list, fam hx and immunization status , as well as social and family history  See HPI Labs reviewed  Encouraged to get her covid booster vaccine Flu shot given today  Interested in shingrix vaccines  dexa ordered  With cerebellar ataxia very high fall risk. No fx and D level is tx  Advance directive is utd No cognitive concerns Hearing screen rev-pt declines audiology eval  Vision/eye exam is utd         Estrogen deficiency   Relevant Orders   DG Bone Density    Other Visit Diagnoses    Need for influenza vaccination       Relevant Orders   Flu Vaccine QUAD High Dose(Fluad) (Completed)

## 2020-09-15 NOTE — Patient Instructions (Addendum)
If you are interested in the new shingles vaccine (Shingrix) - call your local pharmacy to check on coverage and availability  If affordable, get on a wait list at your pharmacy to get the vaccine.  Get your covid booster , you don't have to wait   Call to schedule your bone density test   For cholesterol Avoid red meat/ fried foods/ egg yolks/ fatty breakfast meats/ butter, cheese and high fat dairy/ and shellfish '  Watching your thyroid number  Let's re check in 6 months    Our prescribing system is down right now  Call next week to remind [place] to send your medicines in

## 2020-09-17 ENCOUNTER — Encounter: Payer: Self-pay | Admitting: Family Medicine

## 2020-09-17 NOTE — Assessment & Plan Note (Signed)
Due for dexa-ordered High fall risk No fx Taking vit D and level is therapeutic

## 2020-09-17 NOTE — Assessment & Plan Note (Signed)
Reviewed health habits including diet and exercise and skin cancer prevention Reviewed appropriate screening tests for age  Also reviewed health mt list, fam hx and immunization status , as well as social and family history   See HPI Labs reviewed  Encouraged to get her covid booster vaccine Flu shot given today  Interested in shingrix vaccines  dexa ordered  With cerebellar ataxia very high fall risk. No fx and D level is tx  Advance directive is utd No cognitive concerns Hearing screen rev-pt declines audiology eval  Vision/eye exam is utd

## 2020-09-17 NOTE — Assessment & Plan Note (Signed)
Disc goals for lipids and reasons to control them Rev last labs with pt Rev low sat fat diet in detail LDL up to 187 -poor diet lately  Cannot have a statin due to her neuromuscular dz

## 2020-09-17 NOTE — Assessment & Plan Note (Signed)
bp in fair control at this time  BP Readings from Last 1 Encounters:  09/15/20 136/86   No changes needed Will continue hctz 25 mg and metoprolol xl 50 mg daily   Most recent labs reviewed  Disc lifstyle change with low sodium diet and exercise

## 2020-09-17 NOTE — Assessment & Plan Note (Signed)
Vitamin D level is therapeutic with current supplementation Disc importance of this to bone and overall health Level of 49

## 2020-09-17 NOTE — Assessment & Plan Note (Signed)
Progressive  Continues neurologic care  High fall risk /req more assistance

## 2020-09-18 ENCOUNTER — Other Ambulatory Visit: Payer: Self-pay

## 2020-09-18 MED ORDER — METOPROLOL SUCCINATE ER 50 MG PO TB24
ORAL_TABLET | ORAL | 3 refills | Status: DC
Start: 2020-09-18 — End: 2021-09-19

## 2020-09-18 MED ORDER — HYDROCHLOROTHIAZIDE 25 MG PO TABS
25.0000 mg | ORAL_TABLET | Freq: Every day | ORAL | 3 refills | Status: DC
Start: 2020-09-18 — End: 2021-09-19

## 2020-09-18 NOTE — Telephone Encounter (Signed)
Pt was in office on 09/15/20 but refill for meds not working. Sent in refills for metoprolol and HCTZ to [facility] mail order pharmacy. Jaime Mccarthy voiced understanding. (DPR signed).nothing further needed.

## 2020-10-30 ENCOUNTER — Ambulatory Visit
Admission: RE | Admit: 2020-10-30 | Discharge: 2020-10-30 | Disposition: A | Discharge status: Home / Self Care | Payer: Medicare PPO | Source: Ambulatory Visit | Attending: Family Medicine | Admitting: Family Medicine

## 2020-10-30 ENCOUNTER — Other Ambulatory Visit: Payer: Self-pay

## 2020-10-30 DIAGNOSIS — E2839 Other primary ovarian failure: Secondary | ICD-10-CM | POA: Insufficient documentation

## 2020-10-30 DIAGNOSIS — Z780 Asymptomatic menopausal state: Secondary | ICD-10-CM | POA: Diagnosis not present

## 2020-10-30 DIAGNOSIS — E559 Vitamin D deficiency, unspecified: Secondary | ICD-10-CM | POA: Diagnosis not present

## 2020-10-30 DIAGNOSIS — M85852 Other specified disorders of bone density and structure, left thigh: Secondary | ICD-10-CM | POA: Diagnosis not present

## 2020-12-19 ENCOUNTER — Ambulatory Visit: Payer: Medicare PPO | Admitting: Neurology

## 2021-01-17 ENCOUNTER — Encounter: Payer: Self-pay | Admitting: Family Medicine

## 2021-01-17 DIAGNOSIS — Z1231 Encounter for screening mammogram for malignant neoplasm of breast: Secondary | ICD-10-CM | POA: Diagnosis not present

## 2021-02-28 ENCOUNTER — Telehealth: Payer: Self-pay | Admitting: Family Medicine

## 2021-02-28 DIAGNOSIS — R7989 Other specified abnormal findings of blood chemistry: Secondary | ICD-10-CM | POA: Insufficient documentation

## 2021-02-28 NOTE — Telephone Encounter (Signed)
-----   Message from Cloyd Stagers, RT sent at 02/26/2021  1:36 PM EDT ----- Regarding: Lab Orders for Thursday 4.14.2022 Please place lab orders for Thursday 4.14.2022, appt notes state "labs" Thank you, Dyke Maes RT(R)

## 2021-03-05 NOTE — Progress Notes (Signed)
PATIENT: Jaime Mccarthy DOB: Jul 09, 1941  REASON FOR VISIT: follow up HISTORY FROM: patient  HISTORY OF PRESENT ILLNESS: Today 03/06/21  Ms. 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  Continues to live with her husband.  Her condition continues to have a slow progression.  She still uses her walker in the home.  On average she may have 1-2 falls a month.  When she goes out she uses a wheelchair.  Does not feel she would be able to operate a scooter or an IT trainer wheelchair.  When she stands, both her knees hyperextend, making them feel weak.  She is able to do her own ADLs, does need assistance in the shower.  The right knee is most bothersome, previously saw sports medicine doctor years ago, had injections to the knees, considering going back.  Presents today for evaluation accompanied by husband.  Update 12/16/2019 SS: Ms. 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  She has had a very slow gradual progression of her ability to ambulate.  She uses a walker, but is still prone to fall.  She continues to have more difficulty walking, she is falling about 2-3 times a month.  She says she would fall more if she did not catch herself.  She uses her walker at home, uses her wheelchair when she goes out.  She has noticed that when she is standing, her right knee feels weak, that her right leg bows, she been wearing a knee sleeve.  She also reports some urinary and bowel incontinence when she stands.  She is able to use the bathroom, and do her bathing, as she has adapted her bathroom for safety.  She is considering getting an IT trainer wheelchair or scooter.  She presents today for evaluation accompanied by her daughter.  HISTORY 12/10/2018 Dr. Jannifer Franklin: Ms. 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   The patient has had a very slow gradual progression of her ability to ambulate.  She is falling once or twice a month, even with a walker.  She oftentimes will fall backwards landing on her buttocks but she fortunately has not sustained any significant injuries.  The patient has modified her bathroom and shower to increase safety.  She denies any issues with swallowing per se.  She uses a wheelchair when outside of the house.  She will continue to walk some with a walker inside the house.  Her speech is affected with the ataxia.  She returns for an evaluation.  REVIEW OF SYSTEMS: Out of a complete 14 system review of symptoms, the patient complains only of the following symptoms, and all other reviewed systems are negative.  Weakness, falls  ALLERGIES: No Known Allergies  HOME MEDICATIONS: Outpatient Medications Prior to Visit  Medication Sig Dispense Refill  . aspirin 325 MG tablet Take 325 mg by mouth 4 (four) times a week.     . cholecalciferol (VITAMIN D) 1000 units tablet Take 1,000 Units by mouth daily.     . diphenhydrAMINE HCl ([facility] ALLERGY PO) Take 1 tablet by mouth daily as needed.    . Flaxseed, Linseed, (FLAX SEED OIL) 1000 MG CAPS Take 1 capsule by mouth daily.     . hydrochlorothiazide (HYDRODIURIL) 25 MG tablet Take 1 tablet (25 mg total) by mouth daily. 90 tablet 3  . metoprolol succinate (TOPROL-XL)  50 MG 24 hr tablet Take 1/2 pill by mouth once daily 45 tablet 3  . MULTIPLE VITAMIN PO Take by mouth daily.     No facility-administered medications prior to visit.    PAST MEDICAL HISTORY: Past Medical History:  Diagnosis Date  . Allergy    allergic rhinitis seasonal  . Ataxia    degenerative cerebellar  . Degenerative arthritis     Left knee  . Dyslipidemia   . Gait disorder   . Hyperlipidemia   . Hypertension   . 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 BREAST SURGERY     left breast needle  biopsy negative  . GALLBLADDER SURGERY      FAMILY HISTORY: Family History  Problem Relation Age of Onset  . COPD Mother   . Cancer Mother        breast Ca  . Heart disease Father        CAD  . Cancer Father        lung CA  . Depression Daughter   . Heart disease Maternal Grandmother        heart failure    SOCIAL HISTORY: Social History   Socioeconomic History  . Marital status: Married    Spouse name: Not on file  . Number of children: 1  . Years of education: 76  . Highest education level: Not on file  Occupational History    Employer: [employer]  Tobacco Use  . Smoking status: Former Research scientist (life sciences)  . Smokeless tobacco: Never Used  Vaping Use  . Vaping Use: Never used  Substance and Sexual Activity  . Alcohol use: No    Comment: occasional  . Drug use: No  . Sexual activity: Not on file  Other Topics Concern  . Not on file  Social History Narrative   Patient drinks about 2 cups of caffeine daily.   Patient is right handed.    Social Determinants of Health   Financial Resource Strain: Not on file  Food Insecurity: Not on file  Transportation Needs: Not on file  Physical Activity: Not on file  Stress: Not on file  Social Connections: Not on file  Intimate Partner Violence: Not on file   PHYSICAL EXAM  Vitals:   03/06/21 1039  BP: 135/77  Pulse: 67  Weight: 167 lb (75.8 kg)  Height: 5\' 4"  (1.626 m)   Body mass index is 28.67 kg/m.  Generalized: Well developed, in no acute distress  Neurological examination  Mentation: Alert oriented to time, place, history taking. Follows all commands speech, ataxia with speech Cranial nerve II-XII: Pupils were equal round reactive to light. Extraocular movements were full, visual field were full on confrontational test. Facial sensation and strength were normal.  Head turning and shoulder shrug  were normal and symmetric. Motor: Good strength of all extremities. Sensory: Sensory testing is intact to soft touch  on all 4 extremities. No evidence of extinction is noted.  Coordination: Mild ataxia with finger-nose-finger and heel-to-shin bilaterally Gait and station: She is able to stand with pushoff and assistance from her husband, with standing, both knees hyperextend, she is shaky Reflexes: Deep tendon reflexes are symmetric   DIAGNOSTIC DATA (LABS, IMAGING, TESTING) - I reviewed patient records, labs, notes, testing and imaging myself where available.  Lab Results  Component Value Date   WBC 7.7 09/08/2020   HGB 14.3 09/08/2020   HCT 42.8 09/08/2020   MCV 89.4 09/08/2020  PLT 307.0 09/08/2020      Component Value Date/Time   NA 140 09/08/2020 0917   K 3.6 09/08/2020 0917   CL 102 09/08/2020 0917   CO2 28 09/08/2020 0917   GLUCOSE 98 09/08/2020 0917   BUN 13 09/08/2020 0917   CREATININE 0.81 09/08/2020 0917   CALCIUM 10.3 09/08/2020 0917   PROT 7.1 09/08/2020 0917   ALBUMIN 4.3 09/08/2020 0917   AST 21 09/08/2020 0917   ALT 24 09/08/2020 0917   ALKPHOS 85 09/08/2020 0917   BILITOT 0.7 09/08/2020 0917   GFRNONAA 73.95 08/28/2010 0947   GFRAA 109 06/02/2007 0856   Lab Results  Component Value Date   CHOL 274 (H) 09/08/2020   HDL 60.80 09/08/2020   LDLCALC 187 (H) 09/08/2020   LDLDIRECT 186.5 11/18/2013   TRIG 129.0 09/08/2020   CHOLHDL 5 09/08/2020   No results found for: HGBA1C Lab Results  Component Value Date   VITAMINB12 348 03/02/2013   Lab Results  Component Value Date   TSH 5.06 (H) 09/08/2020   ASSESSMENT AND PLAN 80 y.o. 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 here with:  1.  Spinocerebellar ataxia 2.  Gait disturbance  -I will write for bilateral knee braces due to hyperextension with weightbearing, [facility]; for now using walker in the home, hope to maintain as much independence as  possible while being safe to prevent falls -When she feels she needs a new standard wheelchair, I will write a prescription for this -Does not think she can operate a scooter or electric wheelchair -We will follow-up in 1 year or sooner if needed  I spent 30 minutes of face-to-face and non-face-to-face time with patient.  This included previsit chart review, lab review, study review, order entry, electronic health record documentation, patient education.  Butler Denmark, AGNP-C, DNP 03/06/2021, 10:44 AM [facility] [address], [place], [place] [address] [phone]

## 2021-03-06 ENCOUNTER — Encounter: Payer: Self-pay | Admitting: Neurology

## 2021-03-06 ENCOUNTER — Ambulatory Visit: Payer: Medicare PPO | Admitting: Neurology

## 2021-03-06 VITALS — BP 135/77 | HR 67 | Ht 64.0 in | Wt 167.0 lb

## 2021-03-06 DIAGNOSIS — R269 Unspecified abnormalities of gait and mobility: Secondary | ICD-10-CM

## 2021-03-06 DIAGNOSIS — G118 Other hereditary ataxias: Secondary | ICD-10-CM | POA: Diagnosis not present

## 2021-03-06 NOTE — Patient Instructions (Signed)
Please go to [facility] to be fitted for knee braces  Be careful not fall  I will write for wheelchair when needed See you back in 1 year

## 2021-03-07 NOTE — Progress Notes (Signed)
I have read the note, and I agree with the clinical assessment and plan.  Jaycob Mcclenton K Magalie Almon   

## 2021-03-15 ENCOUNTER — Other Ambulatory Visit: Payer: Medicare PPO

## 2021-03-15 ENCOUNTER — Other Ambulatory Visit (INDEPENDENT_AMBULATORY_CARE_PROVIDER_SITE_OTHER): Payer: Medicare PPO

## 2021-03-15 ENCOUNTER — Other Ambulatory Visit: Payer: Self-pay

## 2021-03-15 DIAGNOSIS — R7989 Other specified abnormal findings of blood chemistry: Secondary | ICD-10-CM | POA: Diagnosis not present

## 2021-03-15 LAB — TSH: TSH: 3.31 u[IU]/mL (ref 0.35–4.50)

## 2021-03-15 LAB — T4, FREE: Free T4: 0.61 ng/dL (ref 0.60–1.60)

## 2021-03-16 ENCOUNTER — Other Ambulatory Visit: Payer: Medicare PPO

## 2021-03-19 ENCOUNTER — Telehealth: Payer: Self-pay | Admitting: *Deleted

## 2021-03-19 NOTE — Telephone Encounter (Signed)
Called patient to give her test results and got her voicemail. Left a message on voicemail for patient to call the office back. Patient needs to be given her lab results when she calls the office back.

## 2021-03-21 NOTE — Telephone Encounter (Signed)
See lab results. Patient notified.

## 2021-04-10 DIAGNOSIS — M2351 Chronic instability of knee, right knee: Secondary | ICD-10-CM | POA: Diagnosis not present

## 2021-04-25 ENCOUNTER — Encounter: Payer: Self-pay | Admitting: *Deleted

## 2021-04-26 ENCOUNTER — Telehealth: Payer: Self-pay | Admitting: *Deleted

## 2021-04-26 NOTE — Telephone Encounter (Signed)
Letter ready for p/u @ the front desk.

## 2021-04-26 NOTE — Telephone Encounter (Signed)
I called pt, spoke to husband about jury summons.  I relayed that pt needs to write letter since her age, she wants to be permanently excused from jury duty.  I tried twice to explain that pt needs to do this but he did not get this.  I finally stated that will write letter (not from a medical standpoint) that pt is requesting to be permanently excused from jury duty due to her age.  Gave to medical records.  Needs copy of her drivers license along with letter.

## 2021-08-11 ENCOUNTER — Encounter: Payer: Self-pay | Admitting: Family Medicine

## 2021-09-03 DIAGNOSIS — H2513 Age-related nuclear cataract, bilateral: Secondary | ICD-10-CM | POA: Diagnosis not present

## 2021-09-03 DIAGNOSIS — H5203 Hypermetropia, bilateral: Secondary | ICD-10-CM | POA: Diagnosis not present

## 2021-09-03 DIAGNOSIS — H25013 Cortical age-related cataract, bilateral: Secondary | ICD-10-CM | POA: Diagnosis not present

## 2021-09-03 DIAGNOSIS — H0288A Meibomian gland dysfunction right eye, upper and lower eyelids: Secondary | ICD-10-CM | POA: Diagnosis not present

## 2021-09-03 DIAGNOSIS — H0288B Meibomian gland dysfunction left eye, upper and lower eyelids: Secondary | ICD-10-CM | POA: Diagnosis not present

## 2021-09-03 DIAGNOSIS — H524 Presbyopia: Secondary | ICD-10-CM | POA: Diagnosis not present

## 2021-09-12 ENCOUNTER — Telehealth: Payer: Self-pay | Admitting: Family Medicine

## 2021-09-12 DIAGNOSIS — I10 Essential (primary) hypertension: Secondary | ICD-10-CM

## 2021-09-12 DIAGNOSIS — R7989 Other specified abnormal findings of blood chemistry: Secondary | ICD-10-CM

## 2021-09-12 DIAGNOSIS — E559 Vitamin D deficiency, unspecified: Secondary | ICD-10-CM

## 2021-09-12 DIAGNOSIS — E7800 Pure hypercholesterolemia, unspecified: Secondary | ICD-10-CM

## 2021-09-12 NOTE — Telephone Encounter (Signed)
-----   Message from Ellamae Sia sent at 08/23/2021  3:17 PM EDT ----- Regarding: Lab orders for Thursday. 10.13.22 Patient is scheduled for CPX labs, please order future labs, Thanks , Karna Christmas

## 2021-09-13 ENCOUNTER — Other Ambulatory Visit: Payer: Medicare PPO

## 2021-09-19 ENCOUNTER — Encounter: Payer: Self-pay | Admitting: Family Medicine

## 2021-09-19 ENCOUNTER — Ambulatory Visit (INDEPENDENT_AMBULATORY_CARE_PROVIDER_SITE_OTHER): Payer: Medicare PPO | Admitting: Family Medicine

## 2021-09-19 ENCOUNTER — Other Ambulatory Visit: Payer: Self-pay

## 2021-09-19 VITALS — BP 136/78 | HR 61 | Temp 97.4°F

## 2021-09-19 DIAGNOSIS — G118 Other hereditary ataxias: Secondary | ICD-10-CM

## 2021-09-19 DIAGNOSIS — R7989 Other specified abnormal findings of blood chemistry: Secondary | ICD-10-CM

## 2021-09-19 DIAGNOSIS — I10 Essential (primary) hypertension: Secondary | ICD-10-CM | POA: Diagnosis not present

## 2021-09-19 DIAGNOSIS — E7800 Pure hypercholesterolemia, unspecified: Secondary | ICD-10-CM | POA: Diagnosis not present

## 2021-09-19 DIAGNOSIS — Z0000 Encounter for general adult medical examination without abnormal findings: Secondary | ICD-10-CM

## 2021-09-19 DIAGNOSIS — E559 Vitamin D deficiency, unspecified: Secondary | ICD-10-CM

## 2021-09-19 DIAGNOSIS — Z23 Encounter for immunization: Secondary | ICD-10-CM

## 2021-09-19 DIAGNOSIS — Z1211 Encounter for screening for malignant neoplasm of colon: Secondary | ICD-10-CM

## 2021-09-19 DIAGNOSIS — M85859 Other specified disorders of bone density and structure, unspecified thigh: Secondary | ICD-10-CM | POA: Diagnosis not present

## 2021-09-19 DIAGNOSIS — R195 Other fecal abnormalities: Secondary | ICD-10-CM | POA: Insufficient documentation

## 2021-09-19 LAB — COMPREHENSIVE METABOLIC PANEL
ALT: 20 U/L (ref 0–35)
AST: 19 U/L (ref 0–37)
Albumin: 4.5 g/dL (ref 3.5–5.2)
Alkaline Phosphatase: 86 U/L (ref 39–117)
BUN: 12 mg/dL (ref 6–23)
CO2: 32 mEq/L (ref 19–32)
Calcium: 10.2 mg/dL (ref 8.4–10.5)
Chloride: 99 mEq/L (ref 96–112)
Creatinine, Ser: 0.71 mg/dL (ref 0.40–1.20)
GFR: 81.76 mL/min (ref >60.00)
Glucose, Bld: 94 mg/dL (ref 70–99)
Potassium: 3.7 mEq/L (ref 3.5–5.1)
Sodium: 139 mEq/L (ref 135–145)
Total Bilirubin: 0.7 mg/dL (ref 0.2–1.2)
Total Protein: 7.3 g/dL (ref 6.0–8.3)

## 2021-09-19 LAB — CBC WITH DIFFERENTIAL/PLATELET
Basophils Absolute: 0 10*3/uL (ref 0.0–0.1)
Basophils Relative: 0.4 % (ref 0.0–3.0)
Eosinophils Absolute: 0.1 10*3/uL (ref 0.0–0.7)
Eosinophils Relative: 1.1 % (ref 0.0–5.0)
HCT: 44.2 % (ref 36.0–46.0)
Hemoglobin: 14.4 g/dL (ref 12.0–15.0)
Lymphocytes Relative: 26 % (ref 12.0–46.0)
Lymphs Abs: 2.1 10*3/uL (ref 0.7–4.0)
MCHC: 32.6 g/dL (ref 30.0–36.0)
MCV: 90.4 fl (ref 78.0–100.0)
Monocytes Absolute: 0.6 10*3/uL (ref 0.1–1.0)
Monocytes Relative: 7.6 % (ref 3.0–12.0)
Neutro Abs: 5.3 10*3/uL (ref 1.4–7.7)
Neutrophils Relative %: 64.9 % (ref 43.0–77.0)
Platelets: 323 10*3/uL (ref 150.0–400.0)
RBC: 4.89 Mil/uL (ref 3.87–5.11)
RDW: 14.4 % (ref 11.5–15.5)
WBC: 8.1 10*3/uL (ref 4.0–10.5)

## 2021-09-19 LAB — VITAMIN D 25 HYDROXY (VIT D DEFICIENCY, FRACTURES): VITD: 47.27 ng/mL (ref 30.00–100.00)

## 2021-09-19 LAB — LIPID PANEL
Cholesterol: 277 mg/dL — ABNORMAL HIGH (ref 0–200)
HDL: 63.6 mg/dL (ref >39.00)
LDL Cholesterol: 178 mg/dL — ABNORMAL HIGH (ref 0–99)
NonHDL: 212.92
Total CHOL/HDL Ratio: 4
Triglycerides: 176 mg/dL — ABNORMAL HIGH (ref 0.0–149.0)
VLDL: 35.2 mg/dL (ref 0.0–40.0)

## 2021-09-19 LAB — TSH: TSH: 3.57 u[IU]/mL (ref 0.35–5.50)

## 2021-09-19 LAB — T4, FREE: Free T4: 0.65 ng/dL (ref 0.60–1.60)

## 2021-09-19 MED ORDER — HYDROCHLOROTHIAZIDE 25 MG PO TABS: 25.0000 mg | ORAL_TABLET | Freq: Every day | ORAL | 3 refills | Status: DC

## 2021-09-19 MED ORDER — METOPROLOL SUCCINATE ER 50 MG PO TB24: ORAL_TABLET | ORAL | 3 refills | Status: DC

## 2021-09-19 NOTE — Assessment & Plan Note (Signed)
Unable to do colonoscopy prep or stool kit due to lack of coordination from neuromuscular dz

## 2021-09-19 NOTE — Assessment & Plan Note (Signed)
Reviewed health habits including diet and exercise and skin cancer prevention Reviewed appropriate screening tests for age  Also reviewed health mt list, fam hx and immunization status , as well as social and family history   See HPI Labs ordered  Declines colon cancer screen (unable with neuro problems) Mammogram utd Flu shot given  covid vaccinated Plans shingrix if covered  dexa utd, prone to falls but none recent, uses walker and wheelchair and considering power chair Taking vit D and tums, may inc her ca Adv directive it up to date No cognitive concerns - mentally active and sharp  Nl hearing screen (continues to use debrox) Eye and vision care is utd phq score is 0 Rev ADLs and help received, functionality worsened with neuromuscular dz (consider power chair)

## 2021-09-19 NOTE — Assessment & Plan Note (Signed)
Was nl last time TSH and FT4 ordered

## 2021-09-19 NOTE — Assessment & Plan Note (Signed)
bp in fair control at this time  BP Readings from Last 1 Encounters:  09/19/21 136/78   No changes needed Most recent labs reviewed  Disc lifstyle change with low sodium diet and exercise  Labs ordered

## 2021-09-19 NOTE — Progress Notes (Signed)
Subjective:    Patient ID: Jaime Mccarthy, female    DOB: [birth year], 80 y.o.   MRN: [id number]  This visit occurred during the SARS-CoV-2 public health emergency.  Safety protocols were in place, including screening questions prior to the visit, additional usage of staff PPE, and extensive cleaning of exam room while observing appropriate contact time as indicated for disinfecting solutions.   HPI Pt presents for amw and health mt visit  I have personally reviewed the Medicare Annual Wellness questionnaire and have noted 1. The patient's medical and social history 2. Their use of alcohol, tobacco or illicit drugs 3. Their current medications and supplements 4. The patient's functional ability including ADL's, fall risks, home safety risks and hearing or visual             impairment. 5. Diet and physical activities 6. Evidence for depression or mood disorders  The patients weight, height, BMI have been recorded in the chart and visual acuity is per eye clinic.  I have made referrals, counseling and provided education to the patient based review of the above and I have provided the pt with a written personalized care plan for preventive services. Reviewed and updated provider list, see scanned forms.  See scanned forms.  Routine anticipatory guidance given to patient.  See health maintenance. Colon cancer screening-not able to do colonoscopy or stool kit due to lack of coordination  Breast cancer screening mammogram 2/22 Self breast exam- no lumps  Flu vaccine-given today Tetanus vaccine  03/2015 Td Pneumovax completed Covid vaccinated Zoster vaccine- plans to check on coverage  Dexa 11/21 stable osteopenia  Falls-multiple , due to chronic ataxia (with walker)-not too bad and not recent  Fractures-none Supplements-taking vitamin D (due for level) Also takes tums Exercise -is very limited due to neuromuscular condition   Having more problems with incontinence  On and off  Loose  stool/diarrhea (no pain or other symptoms) She cannot hold it /due to her neuromuscular condition   Advance directive- up to date  Cognitive function addressed- see scanned forms- and if abnormal then additional documentation follows.   Fine with that  Very good memory and cognition  She reads and stays socially active as much as she can  Can do math  No confusion  No problems understanding   PMH and SH reviewed  Meds, vitals, and allergies reviewed.   ROS: See HPI.  Otherwise negative.    Weight : Wt Readings from Last 3 Encounters:  03/06/21 167 lb (75.8 kg)  12/16/19 179 lb (81.2 kg)  12/10/18 184 lb (83.5 kg)  Unable to weigh today/wheelchair  Hearing/vision: Hearing Screening   500Hz  1000Hz  2000Hz  4000Hz   Right ear 40 40 40 40  Left ear 40 40 40 40  Vision Screening - Comments:: Eye exam in Oct 2022 at [facility]   PHQ: Depression screen [facility] 2/9 09/19/2021 09/15/2020 09/08/2019 03/24/2018 03/20/2017  Decreased Interest 0 0 0 0 0  Down, Depressed, Hopeless 0 0 0 0 0  PHQ - 2 Score 0 0 0 0 0  Altered sleeping - - - 0 -  Tired, decreased energy - - - 0 -  Change in appetite - - - 0 -  Feeling bad or failure about yourself  - - - 0 -  Trouble concentrating - - - 0 -  Moving slowly or fidgety/restless - - - 0 -  Suicidal thoughts - - - 0 -  PHQ-9 Score - - - 0 -  Difficult doing work/chores - - - Not difficult at all -    ADLs Pt has cerebellar ataxia  She gets help with some things  She can bathe with help  Cannot prep food  Cannot drive Cannot ambulate w/o asst Husband is there 24 h -he does everything, not needing home care   She is looking into a power chair if it could be small enough    Functionality: worsened with time   Care team  Elmer Boutelle-[facility]- neuro   HTN bp is stable today  No cp or palpitations or headaches or edema  No side effects to medicines  BP Readings from Last 3 Encounters:  09/19/21 136/78  03/06/21 135/77   09/15/20 136/86    Hctz 25 mg daily  Metoprolol xl 50  1/2 daily  (this works well despite splitting it)  Pulse Readings from Last 3 Encounters:  09/19/21 61  03/06/21 67  09/15/20 (!) 55      Hyperlipidemia Lab Results  Component Value Date   CHOL 274 (H) 09/08/2020   HDL 60.80 09/08/2020   LDLCALC 187 (H) 09/08/2020   LDLDIRECT 186.5 11/18/2013   TRIG 129.0 09/08/2020   CHOLHDL 5 09/08/2020   No statin due to h/o neuromuscular dz Diet -better (quit eating fried food/fried chicken) Flax seed oil   Past elevated tsh Lab Results  Component Value Date   TSH 3.31 03/15/2021   Normal last time  Patient Active Problem List   Diagnosis Date Noted   Loose stools 09/19/2021   Elevated TSH 02/28/2021   Osteopenia 04/29/2017   Estrogen deficiency 03/24/2017   Routine general medical examination at a health care facility 03/19/2016   Cerumen impaction 03/19/2016   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   Encounter for Medicare annual wellness exam 11/26/2013   Colon cancer screening 11/26/2013   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   Abnormality of gait 01/26/2013   Vitamin D deficiency 01/04/2009   DISORDER, 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   FIBROIDS, UTERUS 03/16/2007   HYPERCHOLESTEROLEMIA 03/16/2007   Essential hypertension 03/16/2007   ALLERGIC RHINITIS 03/16/2007   FIBROCYSTIC BREAST DISEASE 03/16/2007   HEART MURMUR, HX OF 03/16/2007   Past Medical History:  Diagnosis Date   Allergy    allergic rhinitis seasonal   Ataxia    degenerative cerebellar   Degenerative arthritis     Left knee   Dyslipidemia    Gait disorder    Hyperlipidemia    Hypertension    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   Past Surgical History:  Procedure Laterality Date   BREAST SURGERY     left breast needle biopsy negative   GALLBLADDER SURGERY     Social History   Tobacco Use   Smoking status: Former   Smokeless tobacco: Never  Vaping Use   Vaping Use: Never used  Substance Use  Topics   Alcohol use: No    Comment: occasional   Drug use: No   Family History  Problem Relation Age of Onset   COPD Mother    Cancer Mother        breast Ca   Heart disease Father        CAD   Cancer Father        lung CA   Depression Daughter    Heart disease Maternal Grandmother        heart failure   No Known Allergies Current Outpatient Medications on File Prior to Visit  Medication Sig Dispense Refill   aspirin 325 MG tablet Take 325  mg by mouth 4 (four) times a week.      bismuth subsalicylate (PEPTO BISMOL) 262 MG/15ML suspension Take 30 mLs by mouth daily as needed.     cholecalciferol (VITAMIN D) 1000 units tablet Take 2,000 Units by mouth daily.     diphenhydrAMINE HCl ([employer] ALLERGY PO) Take 1 tablet by mouth daily as needed.     Flaxseed, Linseed, (FLAX SEED OIL) 1000 MG CAPS Take 1 capsule by mouth daily.      MULTIPLE VITAMIN PO Take by mouth daily.     No current facility-administered medications on file prior to visit.    Review of Systems  Constitutional:  Negative for activity change, appetite change, fatigue, fever and unexpected weight change.  HENT:  Negative for congestion, ear pain, rhinorrhea, sinus pressure and sore throat.   Eyes:  Negative for pain, redness and visual disturbance.  Respiratory:  Negative for cough, shortness of breath and wheezing.   Cardiovascular:  Negative for chest pain and palpitations.  Gastrointestinal:  Positive for diarrhea. Negative for abdominal pain, blood in stool, constipation, nausea, rectal pain and vomiting.  Endocrine: Negative for polydipsia and polyuria.  Genitourinary:  Positive for frequency. Negative for dysuria, pelvic pain and urgency.  Musculoskeletal:  Negative for arthralgias, back pain and myalgias.  Skin:  Negative for pallor and rash.  Allergic/Immunologic: Negative for environmental allergies.  Neurological:  Positive for tremors, speech difficulty and weakness. Negative for dizziness, seizures,  syncope, facial asymmetry, numbness and headaches.  Hematological:  Negative for adenopathy. Does not bruise/bleed easily.  Psychiatric/Behavioral:  Negative for decreased concentration and dysphoric mood. The patient is not nervous/anxious.       Objective:   Physical Exam Constitutional:      General: She is not in acute distress.    Appearance: Normal appearance. She is well-developed and normal weight. She is not ill-appearing or diaphoretic.  HENT:     Head: Normocephalic and atraumatic.     Right Ear: Tympanic membrane, ear canal and external ear normal.     Left Ear: Tympanic membrane, ear canal and external ear normal.     Nose: Nose normal. No congestion.     Mouth/Throat:     Mouth: Mucous membranes are moist.     Pharynx: Oropharynx is clear. No posterior oropharyngeal erythema.  Eyes:     General: No scleral icterus.    Extraocular Movements: Extraocular movements intact.     Conjunctiva/sclera: Conjunctivae normal.     Pupils: Pupils are equal, round, and reactive to light.  Neck:     Thyroid: No thyromegaly.     Vascular: No carotid bruit or JVD.  Cardiovascular:     Rate and Rhythm: Normal rate and regular rhythm.     Pulses: Normal pulses.     Heart sounds: Normal heart sounds.    No gallop.  Pulmonary:     Effort: Pulmonary effort is normal. No respiratory distress.     Breath sounds: Normal breath sounds. No wheezing.     Comments: Good air exch Chest:     Chest wall: No tenderness.  Abdominal:     General: Bowel sounds are normal. There is no distension or abdominal bruit.     Palpations: Abdomen is soft. There is no mass.     Tenderness: There is no abdominal tenderness.     Hernia: No hernia is present.  Genitourinary:    Comments: Breast exam: No mass, nodules, thickening, tenderness, bulging, retraction, inflamation, nipple discharge or skin changes noted.  No axillary or clavicular LA.     Musculoskeletal:        General: No tenderness. Normal  range of motion.     Cervical back: Normal range of motion and neck supple. No rigidity. No muscular tenderness.     Right lower leg: No edema.     Left lower leg: No edema.     Comments: No kyphosis  No acute joint changes  Lymphadenopathy:     Cervical: No cervical adenopathy.  Skin:    General: Skin is warm and dry.     Coloration: Skin is not pale.     Findings: No erythema or rash.     Comments: Solar lentigines diffusely  Few sks  Neurological:     Mental Status: She is alert. Mental status is at baseline.     Cranial Nerves: Cranial nerves are intact. No cranial nerve deficit.     Sensory: Sensation is intact.     Motor: Weakness and tremor present. No abnormal muscle tone.     Coordination: Coordination abnormal.     Gait: Gait abnormal.     Deep Tendon Reflexes: Reflexes are normal and symmetric.     Comments: Worsened head and hand tremor /unintentional   Poor balance Cannot stand w/o asst   Exam done in wheelchair   Speech is slow     Psychiatric:        Mood and Affect: Mood normal.        Cognition and Memory: Cognition and memory normal.          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)    Continues neurology care  Advancing Looking into power chair for home  Husband does her care so far  Rev help with ADLs and functionality No recent falls        Musculoskeletal and Integument   Osteopenia    Stable dexa 11/21 No recent falls (is very careful and gets  asst with walker) Disc fall prev May get power wheelchair  Taking tums and D May add more calcium        Other   Vitamin D deficiency    D level today  Disc imp to bone and overall health  Known osteopenia       HYPERCHOLESTEROLEMIA    Disc goals for lipids and reasons to control them Rev last labs with pt Rev low sat fat diet in detail Not statin candidate due to neuromuscular disorder Taking flax  Diet has improved      Relevant Medications   metoprolol succinate (TOPROL-XL) 50 MG 24 hr tablet   hydrochlorothiazide (HYDRODIURIL) 25 MG tablet   Encounter for Medicare annual wellness exam - Primary    Reviewed health habits including diet and exercise and skin cancer prevention Reviewed appropriate screening tests for age  Also reviewed health mt list, fam hx and immunization status , as well as social and family history   See  HPI Labs ordered  Declines colon cancer screen (unable with neuro problems) Mammogram utd Flu shot given  covid vaccinated Plans shingrix if covered  dexa utd, prone to falls but none recent, uses walker and wheelchair and considering power chair Taking vit D and tums, may inc her ca Adv directive it up to date No cognitive concerns - mentally active and sharp  Nl hearing screen (continues to use debrox) Eye and vision care is utd phq score is 0 Rev ADLs and help received, functionality worsened with neuromuscular dz (consider power chair)      Colon cancer screening    Unable to do colonoscopy prep or stool kit due to lack of coordination from neuromuscular dz      Routine general medical examination at a health care facility    Reviewed health habits including diet and exercise and skin cancer prevention Reviewed appropriate screening tests for age  Also reviewed health mt list, fam hx and immunization status , as well as social and family history   See HPI Labs ordered  Declines colon cancer screen (unable with neuro  problems) Mammogram utd Flu shot given  covid vaccinated Plans shingrix if covered  dexa utd, prone to falls but none recent, uses walker and wheelchair and considering power chair Taking vit D and tums, may inc her ca Adv directive it up to date No cognitive concerns - mentally active and sharp  Nl hearing screen (continues to use debrox) Eye and vision care is utd phq score is 0 Rev ADLs and help received, functionality worsened with neuromuscular dz (consider power chair)      Elevated TSH    Was nl last time TSH and FT4 ordered         Loose stools    With neuromusc dz, more loose stool and trouble holding it   Consider inc fiber/metamucil or citrucel to bulk stool pepto prn Add more ca   If not improved will alert us/consider GI Labs done      Other Visit Diagnoses     Need for influenza vaccination       Relevant Orders   Flu Vaccine QUAD High Dose(Fluad) (Completed)

## 2021-09-19 NOTE — Assessment & Plan Note (Signed)
D level today  Disc imp to bone and overall health  Known osteopenia

## 2021-09-19 NOTE — Assessment & Plan Note (Signed)
Stable dexa 11/21 No recent falls (is very careful and gets asst with walker) Disc fall prev May get power wheelchair  Taking tums and D May add more calcium

## 2021-09-19 NOTE — Patient Instructions (Addendum)
Get some metamucil or citrucel (fiber) powder and take daily with liquid of choice  This may bulk up your stool and help you control bowel movements Eat your fruits and veggies also   If that does not help let me know   Taking calcium and /or tums may help firm up stool also   If you are interested in the new shingles vaccine (Shingrix) - call your local pharmacy to check on coverage and availability  If affordable, get on a wait list at your pharmacy to get the vaccine.  Flu shot today  Labs today

## 2021-09-19 NOTE — Assessment & Plan Note (Signed)
With neuromusc dz, more loose stool and trouble holding it   Consider inc fiber/metamucil or citrucel to bulk stool pepto prn Add more ca   If not improved will alert us/consider GI Labs done

## 2021-09-19 NOTE — Assessment & Plan Note (Signed)
Disc goals for lipids and reasons to control them Rev last labs with pt Rev low sat fat diet in detail Not statin candidate due to neuromuscular disorder Taking flax  Diet has improved

## 2021-09-19 NOTE — Assessment & Plan Note (Signed)
Continues neurology care  Advancing Looking into power chair for home  Husband does her care so far  Rev help with ADLs and functionality No recent falls

## 2022-01-23 DIAGNOSIS — Z1231 Encounter for screening mammogram for malignant neoplasm of breast: Secondary | ICD-10-CM | POA: Diagnosis not present

## 2022-01-23 LAB — HM MAMMOGRAPHY

## 2022-01-31 ENCOUNTER — Encounter: Payer: Self-pay | Admitting: Family Medicine

## 2022-03-07 ENCOUNTER — Ambulatory Visit: Payer: Medicare PPO | Admitting: Neurology

## 2022-03-07 VITALS — BP 152/72 | HR 64

## 2022-03-07 DIAGNOSIS — G118 Other hereditary ataxias: Secondary | ICD-10-CM | POA: Diagnosis not present

## 2022-03-07 DIAGNOSIS — R269 Unspecified abnormalities of gait and mobility: Secondary | ICD-10-CM | POA: Diagnosis not present

## 2022-03-07 NOTE — Progress Notes (Signed)
? ? ?Patient: Jaime Mccarthy ?DOB: 09/30/1941 ? ?Reason for visit: Follow up spinocerebellar ataxia ?History from: patient, daughter  ?Primary Neurologist: Dr. Krista Blue  ? ?HISTORY OF PRESENT ILLNESS: ?Today 03/07/22  ?Jaime Mccarthy here today for follow-up.  With her daughter.  She continues to do well.  She has a [employer], broadcasting/film/video.  She is mostly using her wheelchair.  However does use her walker to the bathroom. Does her shower with assistance.  She has noted more shaking in her hands.  Hard to use a computer mouse.  She will have about 1 fall a month.  Fortunately, she has not been injured.  Recently she fell to her knees, she was able to get herself up.  Continues to have a slow progression.  No changes to her overall health. ? 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  Continues to live with her husband.  Her condition continues to have a slow progression.  She still uses her walker in the home.  On average she may have 1-2 falls a month.  When she goes out she uses a wheelchair.  Does not feel she would be able to operate a scooter or an IT trainer wheelchair.  When she stands, both her knees hyperextend, making them feel weak.  She is able to do her own ADLs, does need assistance in the shower.  The right knee is most bothersome, previously saw sports medicine doctor years ago, had injections to the knees, considering going back.  Presents today for evaluation accompanied by husband. ? 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  She has had a very slow gradual progression of her ability to ambulate.  She uses a walker, but is still prone to fall.  She continues to have more difficulty walking, she is falling about 2-3 times a month.  She says she would fall more if she did not catch herself.  She uses her walker at home, uses her wheelchair when she  goes out.  She has noticed that when she is standing, her right knee feels weak, that her right leg bows, she been wearing a knee sleeve.  She also reports some urinary and bowel incontinence when she stands.  She is able to use the bathroom, and do her bathing, as she has adapted her bathroom for safety.  She is considering getting an IT trainer wheelchair or scooter.  She presents today for evaluation accompanied by her daughter. ? ?HISTORY ?12/10/2018 Dr. 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  The patient has had a very slow gradual progression of her ability to ambulate.  She is falling once or twice a month, even with a walker.  She oftentimes will fall backwards landing on her buttocks but she fortunately has not sustained any significant injuries.  The patient has modified her bathroom and shower to increase safety.  She denies any issues with swallowing per se.  She uses a wheelchair when outside of the house.  She will continue to walk some with a walker inside the house.  Her speech is affected with the ataxia.  She returns for an evaluation. ? ?REVIEW OF SYSTEMS: Out of a complete 14 system review of symptoms, the patient complains only of the following symptoms, and all other reviewed systems are negative. ? ?See  HPI ? ?ALLERGIES: ?No Known Allergies ? ?HOME MEDICATIONS: ?Outpatient Medications Prior to Visit  ?Medication Sig Dispense Refill  ? aspirin 325 MG tablet Take 325 mg by mouth 4 (four) times a week.     ? bismuth subsalicylate (PEPTO BISMOL) 262 MG/15ML suspension Take 30 mLs by mouth daily as needed.    ? cholecalciferol (VITAMIN D) 1000 units tablet Take 2,000 Units by mouth daily.    ? diphenhydrAMINE HCl ([employer] ALLERGY PO) Take 1 tablet by mouth daily as needed.    ? Flaxseed, Linseed, (FLAX SEED OIL) 1000 MG CAPS Take 1 capsule by mouth daily.     ? hydrochlorothiazide (HYDRODIURIL) 25 MG tablet  Take 1 tablet (25 mg total) by mouth daily. 90 tablet 3  ? metoprolol succinate (TOPROL-XL) 50 MG 24 hr tablet Take 1/2 pill by mouth once daily 45 tablet 3  ? MULTIPLE VITAMIN PO Take by mouth daily.    ? ?No facility-administered medications prior to visit.  ? ? ?PAST MEDICAL HISTORY: ?Past Medical History:  ?Diagnosis Date  ? Allergy   ? allergic rhinitis seasonal  ? Ataxia   ? degenerative cerebellar  ? Degenerative arthritis   ?  Left knee  ? Dyslipidemia   ? Gait disorder   ? Hyperlipidemia   ? Hypertension   ? 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 ? ?PAST SURGICAL HISTORY: ?Past Surgical History:  ?Procedure Laterality Date  ? BREAST SURGERY    ? left breast needle biopsy negative  ? GALLBLADDER SURGERY    ? ? ?FAMILY HISTORY: ?Family History  ?Problem Relation Age of Onset  ? COPD Mother   ? Cancer Mother   ?     breast Ca  ? Heart disease Father   ?     CAD  ? Cancer Father   ?     lung CA  ? Depression Daughter   ? Heart disease Maternal Grandmother   ?     heart failure  ? ? ?SOCIAL HISTORY: ?Social History  ? ?Socioeconomic History  ? Marital status: Married  ?  Spouse name: Not on file  ? Number of children: 1  ? Years of education: 5  ? Highest education level: Not on file  ?Occupational History  ?  Employer: [employer]  ?Tobacco Use  ? Smoking status: Former  ? Smokeless tobacco: Never  ?Vaping Use  ? Vaping Use: Never used  ?Substance and Sexual Activity  ? Alcohol use: No  ?  Comment: occasional  ? Drug use: No  ? Sexual activity: Not on file  ?Other Topics Concern  ? Not on file  ?Social History Narrative  ? Patient drinks about 2 cups of caffeine daily.  ? Patient is right handed.   ? ?Social Determinants of Health  ? ?Financial Resource Strain: Not on file  ?Food Insecurity: Not on file  ?Transportation Needs: Not on file  ?Physical Activity: Not on file  ?Stress: Not on file  ?Social Connections: Not on file  ?Intimate Partner Violence: Not on file  ? ?PHYSICAL EXAM ? ?Vitals:  ? 03/07/22 1028   ?BP: (!) 152/72  ?Pulse: 64  ? ? ?There is no height or weight on file to calculate BMI. ? ?Generalized: Well developed, in no acute distress  ?Neurological examination  ?Mentation: Alert oriented to time, place, history taking. Follows all commands speech, ataxia with speech. Very pleasant, joyous ?Cranial nerve II-XII: Pupils were equal round reactive to light. End gaze nystagmus bilaterally. Facial sensation and strength  were normal.  Head turning and shoulder shrug  were normal and symmetric. ?Motor: Strength is well-maintained throughout ?Sensory: Sensory testing is intact to soft touch on all 4 extremities. No evidence of extinction is noted.  ?Coordination: Moderate ataxia with finger-nose-finger and heel-to-shin bilaterally ?Gait and station: Able to stand with pushoff and assistance, she has a tendency to lean backwards, was unsteady, was not ambulated without her walker ? ?DIAGNOSTIC DATA (LABS, IMAGING, TESTING) ?- I reviewed patient records, labs, notes, testing and imaging myself where available. ? ?Lab Results  ?Component Value Date  ? WBC 8.1 09/19/2021  ? HGB 14.4 09/19/2021  ? HCT 44.2 09/19/2021  ? MCV 90.4 09/19/2021  ? PLT 323.0 09/19/2021  ? ?   ?Component Value Date/Time  ? NA 139 09/19/2021 1042  ? K 3.7 09/19/2021 1042  ? CL 99 09/19/2021 1042  ? CO2 32 09/19/2021 1042  ? GLUCOSE 94 09/19/2021 1042  ? BUN 12 09/19/2021 1042  ? CREATININE 0.71 09/19/2021 1042  ? CALCIUM 10.2 09/19/2021 1042  ? PROT 7.3 09/19/2021 1042  ? ALBUMIN 4.5 09/19/2021 1042  ? AST 19 09/19/2021 1042  ? ALT 20 09/19/2021 1042  ? ALKPHOS 86 09/19/2021 1042  ? BILITOT 0.7 09/19/2021 1042  ? GFRNONAA 73.95 08/28/2010 0947  ? GFRAA 109 06/02/2007 0856  ? ?Lab Results  ?Component Value Date  ? CHOL 277 (H) 09/19/2021  ? HDL 63.60 09/19/2021  ? LDLCALC 178 (H) 09/19/2021  ? LDLDIRECT 186.5 11/18/2013  ? TRIG 176.0 (H) 09/19/2021  ? CHOLHDL 4 09/19/2021  ? ?No results found for: HGBA1C ?Lab Results  ?Component Value Date  ?  VQXIHWTU88 348 03/02/2013  ? ?Lab Results  ?Component Value Date  ? TSH 3.57 09/19/2021  ? ?ASSESSMENT AND PLAN ?81 y.o. year old female  has a past medical history of Allergy, Ataxia, Degenerative art

## 2022-09-13 ENCOUNTER — Other Ambulatory Visit (INDEPENDENT_AMBULATORY_CARE_PROVIDER_SITE_OTHER): Payer: Medicare PPO

## 2022-09-13 ENCOUNTER — Other Ambulatory Visit: Payer: Self-pay | Admitting: Family Medicine

## 2022-09-13 DIAGNOSIS — Z1159 Encounter for screening for other viral diseases: Secondary | ICD-10-CM

## 2022-09-13 DIAGNOSIS — E559 Vitamin D deficiency, unspecified: Secondary | ICD-10-CM

## 2022-09-13 DIAGNOSIS — R7989 Other specified abnormal findings of blood chemistry: Secondary | ICD-10-CM

## 2022-09-13 DIAGNOSIS — E7800 Pure hypercholesterolemia, unspecified: Secondary | ICD-10-CM

## 2022-09-13 LAB — LIPID PANEL
Cholesterol: 269 mg/dL — ABNORMAL HIGH (ref 0–200)
HDL: 61.7 mg/dL (ref >39.00)
LDL Cholesterol: 172 mg/dL — ABNORMAL HIGH (ref 0–99)
NonHDL: 207.79
Total CHOL/HDL Ratio: 4
Triglycerides: 178 mg/dL — ABNORMAL HIGH (ref 0.0–149.0)
VLDL: 35.6 mg/dL (ref 0.0–40.0)

## 2022-09-13 LAB — COMPREHENSIVE METABOLIC PANEL
ALT: 20 U/L (ref 0–35)
AST: 20 U/L (ref 0–37)
Albumin: 4.2 g/dL (ref 3.5–5.2)
Alkaline Phosphatase: 72 U/L (ref 39–117)
BUN: 10 mg/dL (ref 6–23)
CO2: 30 mEq/L (ref 19–32)
Calcium: 10 mg/dL (ref 8.4–10.5)
Chloride: 100 mEq/L (ref 96–112)
Creatinine, Ser: 0.83 mg/dL (ref 0.40–1.20)
GFR: 67.32 mL/min (ref >60.00)
Glucose, Bld: 85 mg/dL (ref 70–99)
Potassium: 3.8 mEq/L (ref 3.5–5.1)
Sodium: 139 mEq/L (ref 135–145)
Total Bilirubin: 0.6 mg/dL (ref 0.2–1.2)
Total Protein: 6.9 g/dL (ref 6.0–8.3)

## 2022-09-13 LAB — T4, FREE: Free T4: 0.61 ng/dL (ref 0.60–1.60)

## 2022-09-13 LAB — VITAMIN D 25 HYDROXY (VIT D DEFICIENCY, FRACTURES): VITD: 47.13 ng/mL (ref 30.00–100.00)

## 2022-09-13 LAB — TSH: TSH: 3.93 u[IU]/mL (ref 0.35–5.50)

## 2022-09-14 ENCOUNTER — Telehealth: Payer: Self-pay | Admitting: Family Medicine

## 2022-09-14 NOTE — Telephone Encounter (Signed)
-----   Message from Ellamae Sia sent at 09/06/2022  3:30 PM EDT ----- Regarding: Lab orders for Monday, 10.16.23 Patient is scheduled for CPX labs, please order future labs, Thanks , Karna Christmas

## 2022-09-16 LAB — HEPATITIS C ANTIBODY: Hepatitis C Ab: NONREACTIVE

## 2022-09-20 ENCOUNTER — Encounter: Payer: Self-pay | Admitting: Family Medicine

## 2022-09-20 ENCOUNTER — Ambulatory Visit (INDEPENDENT_AMBULATORY_CARE_PROVIDER_SITE_OTHER): Payer: Medicare PPO | Admitting: Family Medicine

## 2022-09-20 VITALS — BP 122/68 | HR 52 | Temp 97.9°F

## 2022-09-20 DIAGNOSIS — R7989 Other specified abnormal findings of blood chemistry: Secondary | ICD-10-CM | POA: Diagnosis not present

## 2022-09-20 DIAGNOSIS — G118 Other hereditary ataxias: Secondary | ICD-10-CM | POA: Diagnosis not present

## 2022-09-20 DIAGNOSIS — I10 Essential (primary) hypertension: Secondary | ICD-10-CM | POA: Diagnosis not present

## 2022-09-20 DIAGNOSIS — E559 Vitamin D deficiency, unspecified: Secondary | ICD-10-CM

## 2022-09-20 DIAGNOSIS — M85859 Other specified disorders of bone density and structure, unspecified thigh: Secondary | ICD-10-CM

## 2022-09-20 DIAGNOSIS — E2839 Other primary ovarian failure: Secondary | ICD-10-CM | POA: Diagnosis not present

## 2022-09-20 DIAGNOSIS — Z1211 Encounter for screening for malignant neoplasm of colon: Secondary | ICD-10-CM

## 2022-09-20 DIAGNOSIS — Z23 Encounter for immunization: Secondary | ICD-10-CM

## 2022-09-20 DIAGNOSIS — Z0000 Encounter for general adult medical examination without abnormal findings: Secondary | ICD-10-CM

## 2022-09-20 DIAGNOSIS — E7800 Pure hypercholesterolemia, unspecified: Secondary | ICD-10-CM | POA: Diagnosis not present

## 2022-09-20 DIAGNOSIS — H6123 Impacted cerumen, bilateral: Secondary | ICD-10-CM

## 2022-09-20 MED ORDER — METOPROLOL SUCCINATE ER 25 MG PO TB24: 25.0000 mg | ORAL_TABLET | Freq: Every day | ORAL | 3 refills | Status: DC

## 2022-09-20 MED ORDER — EZETIMIBE 10 MG PO TABS: 10.0000 mg | ORAL_TABLET | Freq: Every day | ORAL | 3 refills | Status: DC

## 2022-09-20 MED ORDER — HYDROCHLOROTHIAZIDE 25 MG PO TABS: 25.0000 mg | ORAL_TABLET | Freq: Every day | ORAL | 3 refills | Status: DC

## 2022-09-20 NOTE — Assessment & Plan Note (Signed)
Progressive  Sees neurology  Declines PT  Enc home exercise program Fall prevention disc Wheelchair Gilford Rile

## 2022-09-20 NOTE — Assessment & Plan Note (Signed)
Unable in light of neuro muscular status She declines

## 2022-09-20 NOTE — Assessment & Plan Note (Signed)
bp in fair control at this time  BP Readings from Last 1 Encounters:  09/20/22 122/68   No changes needed Most recent labs reviewed  Disc lifstyle change with low sodium diet and exercise  Plan to continue hctz 25 mg daily  Metoprolol xl 25 mg daily

## 2022-09-20 NOTE — Assessment & Plan Note (Signed)
Disc goals for lipids and reasons to control them Rev last labs with pt Rev low sat fat diet in detail Cannot take statin due to neuro musc dz  Will try zetia 10 mg daily and update if any side eff Lab in 6-8 wk

## 2022-09-20 NOTE — Assessment & Plan Note (Signed)
Due for dexa December Order in  Less falls/ no fractures  D level is tx   Disc efforts to prevent falls

## 2022-09-20 NOTE — Progress Notes (Signed)
Subjective:    Patient ID: Jaime Mccarthy, female    DOB: January 02, 1941, 81 y.o.   MRN: [id number]  HPI Here for health maintenance exam and to review chronic medical problems   Wt Readings from Last 3 Encounters:  03/06/21 167 lb (75.8 kg)  12/16/19 179 lb (81.2 kg)  12/10/18 184 lb (83.5 kg)   Unable to weigh in light of neuro musc dz /strength   Immunization History  Administered Date(s) Administered   Fluad Quad(high Dose 65+) 09/08/2019, 09/15/2020, 09/19/2021   Influenza Split 12/09/2012   Influenza,inj,Quad PF,6+ Mos 11/26/2013   Influenza-Unspecified 09/28/2015   PFIZER(Purple Top)SARS-COV-2 Vaccination 12/23/2019, 01/13/2020, 12/05/2020   Pneumococcal Conjugate-13 03/13/2015   Pneumococcal Polysaccharide-23 11/18/2012   Td 03/21/2005, 03/13/2015   Zoster, Live 09/05/2014   Health Maintenance Due  Topic Date Due   Zoster Vaccines- Shingrix (1 of 2) Never done   COVID-19 Vaccine (4 - Pfizer series) 01/30/2021   INFLUENZA VACCINE  07/02/2022   Shingrix-  Did have zostavax   Flu shot - today   Mammogram 01/2022 Self exam: no lumps / does not really check   Dexa  10/2020  is interested  Osteopenia  Falls: less than in the past  Fractures: none  Supplements : vit D  D level is 47.1  Exercise : some walking with assistance   Colonoscopy 04  Unable to do any colon cancer screening at this time due to neuro issues  Too difficult physically  HTN bp is stable today  No cp or palpitations or headaches or edema  No side effects to medicines  BP Readings from Last 3 Encounters:  09/20/22 122/68  03/07/22 (!) 152/72  09/19/21 136/78    Hctz 25 mg daily  Metoprolol xk 25 ng daily Pulse Readings from Last 3 Encounters:  09/20/22 (!) 52  03/07/22 64  09/19/21 61     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  No pain  Still mentally sharp  Feel pretty good     Past inc tsh Lab Results  Component Value Date   TSH 3.93 09/13/2022   FT4 nl at  0.61   Hyperlipidemia Lab Results  Component Value Date   CHOL 269 (H) 09/13/2022   CHOL 277 (H) 09/19/2021   CHOL 274 (H) 09/08/2020   Lab Results  Component Value Date   HDL 61.70 09/13/2022   HDL 63.60 09/19/2021   HDL 60.80 09/08/2020   Lab Results  Component Value Date   LDLCALC 172 (H) 09/13/2022   LDLCALC 178 (H) 09/19/2021   LDLCALC 187 (H) 09/08/2020   Lab Results  Component Value Date   TRIG 178.0 (H) 09/13/2022   TRIG 176.0 (H) 09/19/2021   TRIG 129.0 09/08/2020   Lab Results  Component Value Date   CHOLHDL 4 09/13/2022   CHOLHDL 4 09/19/2021   CHOLHDL 5 09/08/2020   Lab Results  Component Value Date   LDLDIRECT 186.5 11/18/2013   LDLDIRECT 147.0 09/25/2012   LDLDIRECT 133.8 03/03/2012   Not a candidate for statin due to neuromuscular disorder  Takes flax seed   Patient Active Problem List   Diagnosis Date Noted   Elevated TSH 02/28/2021   Osteopenia 04/29/2017   Estrogen deficiency 03/24/2017   Routine general medical examination at a health care facility 03/19/2016   Cerumen impaction 03/19/2016   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   Encounter for Medicare annual wellness exam 11/26/2013   Colon cancer screening 11/26/2013   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   Abnormality  of gait 01/26/2013   Vitamin D deficiency 01/04/2009   DISORDER, 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   FIBROIDS, UTERUS 03/16/2007   HYPERCHOLESTEROLEMIA 03/16/2007   Essential hypertension 03/16/2007   ALLERGIC RHINITIS 03/16/2007   FIBROCYSTIC BREAST DISEASE 03/16/2007   HEART MURMUR, HX OF 03/16/2007   Past Medical History:  Diagnosis Date   Allergy    allergic rhinitis seasonal   Ataxia    degenerative cerebellar   Degenerative arthritis     Left knee   Dyslipidemia    Gait disorder    Hyperlipidemia    Hypertension    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   Past Surgical History:  Procedure Laterality Date   BREAST SURGERY     left breast needle biopsy negative    GALLBLADDER SURGERY     Social History   Tobacco Use   Smoking status: Former   Smokeless tobacco: Never  Vaping Use   Vaping Use: Never used  Substance Use Topics   Alcohol use: No    Comment: occasional   Drug use: No   Family History  Problem Relation Age of Onset   COPD Mother    Cancer Mother        breast Ca   Heart disease Father        CAD   Cancer Father        lung CA   Depression Daughter    Heart disease Maternal Grandmother        heart failure   No Known Allergies Current Outpatient Medications on File Prior to Visit  Medication Sig Dispense Refill   aspirin 325 MG tablet Take 325 mg by mouth 2 (two) times a week.     bismuth subsalicylate (PEPTO BISMOL) 262 MG/15ML suspension Take 30 mLs by mouth daily as needed.     cholecalciferol (VITAMIN D) 1000 units tablet Take 2,000 Units by mouth daily.     diphenhydrAMINE HCl ([employer] ALLERGY PO) Take 1 tablet by mouth daily as needed.     MULTIPLE VITAMIN PO Take by mouth daily.     psyllium (METAMUCIL) 58.6 % powder Take 1 packet by mouth daily.     No current facility-administered medications on file prior to visit.    Review of Systems  Constitutional:  Positive for fatigue. Negative for activity change, appetite change, fever and unexpected weight change.  HENT:  Negative for congestion, ear pain, rhinorrhea, sinus pressure and sore throat.   Eyes:  Negative for pain, redness and visual disturbance.  Respiratory:  Negative for cough, shortness of breath and wheezing.   Cardiovascular:  Negative for chest pain and palpitations.  Gastrointestinal:  Negative for abdominal pain, blood in stool, constipation and diarrhea.  Endocrine: Negative for polydipsia and polyuria.  Genitourinary:  Negative for dysuria, frequency and urgency.  Musculoskeletal:  Positive for arthralgias. Negative for back pain and myalgias.  Skin:  Negative for pallor and rash.  Allergic/Immunologic: Negative for environmental allergies.   Neurological:  Positive for tremors, speech difficulty and weakness. Negative for dizziness, syncope and headaches.  Hematological:  Negative for adenopathy. Does not bruise/bleed easily.  Psychiatric/Behavioral:  Negative for decreased concentration and dysphoric mood. The patient is not nervous/anxious.        Objective:   Physical Exam Constitutional:      General: She is not in acute distress.    Appearance: Normal appearance. She is well-developed. She is obese. She is not ill-appearing or diaphoretic.  HENT:     Head: Normocephalic and atraumatic.  Right Ear: Tympanic membrane, ear canal and external ear normal.     Left Ear: Tympanic membrane, ear canal and external ear normal.     Nose: Nose normal. No congestion.     Mouth/Throat:     Mouth: Mucous membranes are moist.     Pharynx: Oropharynx is clear. No posterior oropharyngeal erythema.  Eyes:     General: No scleral icterus.    Extraocular Movements: Extraocular movements intact.     Conjunctiva/sclera: Conjunctivae normal.     Pupils: Pupils are equal, round, and reactive to light.  Neck:     Thyroid: No thyromegaly.     Vascular: No carotid bruit or JVD.  Cardiovascular:     Rate and Rhythm: Normal rate and regular rhythm.     Pulses: Normal pulses.     Heart sounds: Normal heart sounds.     No gallop.  Pulmonary:     Effort: Pulmonary effort is normal. No respiratory distress.     Breath sounds: Normal breath sounds. No wheezing.     Comments: Good air exch Chest:     Chest wall: No tenderness.  Abdominal:     General: Bowel sounds are normal. There is no distension or abdominal bruit.     Palpations: Abdomen is soft. There is no mass.     Tenderness: There is no abdominal tenderness.     Hernia: No hernia is present.  Genitourinary:    Comments: Breast exam: No mass, nodules, thickening, tenderness, bulging, retraction, inflamation, nipple discharge or skin changes noted.  No axillary or clavicular LA.      Musculoskeletal:        General: No tenderness. Normal range of motion.     Cervical back: Normal range of motion and neck supple. No rigidity. No muscular tenderness.     Right lower leg: No edema.     Left lower leg: No edema.     Comments: No kyphosis   Lymphadenopathy:     Cervical: No cervical adenopathy.  Skin:    General: Skin is warm and dry.     Coloration: Skin is not pale.     Findings: No erythema or rash.     Comments: Solar lentigines diffusely   Neurological:     Mental Status: She is alert. Mental status is at baseline.     Cranial Nerves: Dysarthria present. No cranial nerve deficit or facial asymmetry.     Motor: Weakness and tremor present. No abnormal muscle tone.     Coordination: Coordination abnormal.     Gait: Gait normal.     Deep Tendon Reflexes: Reflexes are normal and symmetric.     Comments: Baseline weakness /ataxia  In wheelchair  Speech is affected /slowed   Psychiatric:        Mood and Affect: Mood normal.        Cognition and Memory: Cognition and memory normal.           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)    Progressive  Sees neurology  Declines PT  Enc home exercise program Fall prevention disc Wheelchair /walker       Cerumen impaction    Pt denies hearing problems Recommend debrox  Irrigate prn if she returns for visit        Musculoskeletal  and Integument   Osteopenia    Due for dexa December Order in  Less falls/ no fractures  D level is tx   Disc efforts to prevent falls        Other   Colon cancer screening    Unable in light of neuro muscular status She declines       Elevated TSH    TSH and FT4 are in nl range today      Estrogen deficiency   Relevant Orders   DG Bone Density   HYPERCHOLESTEROLEMIA    Disc goals for lipids and reasons to control them Rev last labs with pt Rev low sat fat diet in detail Cannot take statin due to neuro musc dz  Will try zetia 10 mg daily and update if any side eff Lab in 6-8 wk       Relevant Medications   hydrochlorothiazide (HYDRODIURIL) 25 MG tablet   metoprolol succinate (TOPROL-XL) 25 MG 24 hr tablet   ezetimibe (ZETIA) 10 MG tablet   Routine general medical examination at a health care facility - Primary    Reviewed health habits including diet and exercise and skin cancer prevention Reviewed appropriate screening tests for age  Also reviewed health mt list, fam hx and immunization status , as well as social and family history   See HPI Labs reviewed Plans to check into shingrix vaccine at pharmacy  Flu shot given  Mammogram utd 01/2022 dexa due in dec-ordered and pt will call to schedule Less falls and no fractures , taking vit D Disc adding strength training as able Declines colon cancer screening        Vitamin D deficiency    D level of 47.1  Vitamin D level is therapeutic with current supplementation Disc importance of this to bone and overall health       Other Visit Diagnoses     Need for influenza vaccination       Relevant Orders   Flu Vaccine QUAD High Dose(Fluad) (Completed)

## 2022-09-20 NOTE — Assessment & Plan Note (Signed)
D level of 47.1  Vitamin D level is therapeutic with current supplementation Disc importance of this to bone and overall health

## 2022-09-20 NOTE — Assessment & Plan Note (Signed)
TSH and FT4 are in nl range today

## 2022-09-20 NOTE — Assessment & Plan Note (Signed)
Pt denies hearing problems Recommend debrox  Irrigate prn if she returns for visit

## 2022-09-20 NOTE — Patient Instructions (Addendum)
Flu shot today   You have ear wax Debrox ear drops over the counter help  If you want to come back and get ears flushed please make an appt   Think about doing some strength training  Light weights or exercise bands are good to use sitting !   If you are interested in the new shingles vaccine (Shingrix) - call your local pharmacy to check on coverage and availability  If affordable, get on a wait list at your pharmacy to get the vaccine.  Try zetia 10 mg daily for cholesterol If any side effects let us know  Schedule fasting labs 2-6 weeks after you start it    Please call [facility] and schedule your bone density test December or later   Please call the location of your choice from the menu below to schedule your Mammogram and/or Bone Density appointment.    [facility]                      Phone:  [phone], [place] [address]                                                             Services: Traditional and 3D Mammogram, [place] Bone Density                 Phone: [phone] [address], [place] [address]    Service: Bone Density ONLY   *this site does NOT perform mammograms  [facility]                        Phone:  [phone] [address], [place] [address]                                            Services:  3D Mammogram and [place] at [facility]   Phone:  [phone]   [place]  [place], [place] [address]                                            Services: 3D Mammogram and [place]  [place] at [facility])  Phone:  [phone]   [address] Room [place], [place] [address]                                              Services:  3D Mammogram and Bone Density

## 2022-09-20 NOTE — Assessment & Plan Note (Signed)
Reviewed health habits including diet and exercise and skin cancer prevention Reviewed appropriate screening tests for age  Also reviewed health mt list, fam hx and immunization status , as well as social and family history   See HPI Labs reviewed Plans to check into shingrix vaccine at pharmacy  Flu shot given  Mammogram utd 01/2022 dexa due in dec-ordered and pt will call to schedule Less falls and no fractures , taking vit D Disc adding strength training as able Declines colon cancer screening

## 2022-09-24 ENCOUNTER — Telehealth: Payer: Self-pay | Admitting: Family Medicine

## 2022-09-24 NOTE — Telephone Encounter (Signed)
N/A unable to leave a message for patient to call back and schedule the Medicare Annual Wellness Visit (AWV) virtually or by telephone.  Last AWV 09/19/21  Please schedule at anytime with [facility]

## 2022-10-08 ENCOUNTER — Encounter: Payer: Self-pay | Admitting: Family Medicine

## 2022-10-08 DIAGNOSIS — E2839 Other primary ovarian failure: Secondary | ICD-10-CM

## 2022-10-22 ENCOUNTER — Ambulatory Visit (INDEPENDENT_AMBULATORY_CARE_PROVIDER_SITE_OTHER): Payer: Medicare PPO

## 2022-10-22 DIAGNOSIS — Z0000 Encounter for general adult medical examination without abnormal findings: Secondary | ICD-10-CM

## 2022-10-22 NOTE — Patient Instructions (Addendum)
Jaime Mccarthy , Thank you for taking time to come for your Medicare Wellness Visit. I appreciate your ongoing commitment to your health goals. Please review the following plan we discussed and let me know if I can assist you in the future.   These are the goals we discussed:  Goals       Follow up with Primary Care Provider      Starting 03/24/2018, I will continue to take medications as prescribed and to keep appointments with PCP as scheduled.       health management      Starting 03/20/17, I will continue to take medications as prescribed and to maintain a positive outlook on life.       Stay healthy (pt-stated)        This is a list of the screening recommended for you and due dates:  Health Maintenance  Topic Date Due   COVID-19 Vaccine (4 - 2023-24 season) 11/07/2022*   Zoster (Shingles) Vaccine (1 of 2) 01/22/2023*   Mammogram  01/23/2023   Medicare Annual Wellness Visit  10/23/2023   Pneumonia Vaccine  Completed   Flu Shot  Completed   DEXA scan (bone density measurement)  Completed   Hepatitis C Screening: USPSTF Recommendation to screen - Ages 18-79 yo.  Completed   HPV Vaccine  Aged Out   Colon Cancer Screening  Discontinued  *Topic was postponed. The date shown is not the original due date.    Advanced directives: In chart  Conditions/risks identified: None  Next appointment: Follow up in one year for your annual wellness visit     Preventive Care 65 Years and Older, Female Preventive care refers to lifestyle choices and visits with your health care provider that can promote health and wellness. What does preventive care include? A yearly physical exam. This is also called an annual well check. Dental exams once or twice a year. Routine eye exams. Ask your health care provider how often you should have your eyes checked. Personal lifestyle choices, including: Daily care of your teeth and gums. Regular physical activity. Eating a healthy diet. Avoiding tobacco and  drug use. Limiting alcohol use. Practicing safe sex. Taking low-dose aspirin every day. Taking vitamin and mineral supplements as recommended by your health care provider. What happens during an annual well check? The services and screenings done by your health care provider during your annual well check will depend on your age, overall health, lifestyle risk factors, and family history of disease. Counseling  Your health care provider may ask you questions about your: Alcohol use. Tobacco use. Drug use. Emotional well-being. Home and relationship well-being. Sexual activity. Eating habits. History of falls. Memory and ability to understand (cognition). Work and work Statistician. Reproductive health. Screening  You may have the following tests or measurements: Height, weight, and BMI. Blood pressure. Lipid and cholesterol levels. These may be checked every 5 years, or more frequently if you are over 45 years old. Skin check. Lung cancer screening. You may have this screening every year starting at age 36 if you have a 30-pack-year history of smoking and currently smoke or have quit within the past 15 years. Fecal occult blood test (FOBT) of the stool. You may have this test every year starting at age 64. Flexible sigmoidoscopy or colonoscopy. You may have a sigmoidoscopy every 5 years or a colonoscopy every 10 years starting at age 9. Hepatitis C blood test. Hepatitis B blood test. Sexually transmitted disease (STD) testing. Diabetes screening. This is  done by checking your blood sugar (glucose) after you have not eaten for a while (fasting). You may have this done every 1-3 years. Bone density scan. This is done to screen for osteoporosis. You may have this done starting at age 67. Mammogram. This may be done every 1-2 years. Talk to your health care provider about how often you should have regular mammograms. Talk with your health care provider about your test results, treatment  options, and if necessary, the need for more tests. Vaccines  Your health care provider may recommend certain vaccines, such as: Influenza vaccine. This is recommended every year. Tetanus, diphtheria, and acellular pertussis (Tdap, Td) vaccine. You may need a Td booster every 10 years. Zoster vaccine. You may need this after age 56. Pneumococcal 13-valent conjugate (PCV13) vaccine. One dose is recommended after age 32. Pneumococcal polysaccharide (PPSV23) vaccine. One dose is recommended after age 81. Talk to your health care provider about which screenings and vaccines you need and how often you need them. This information is not intended to replace advice given to you by your health care provider. Make sure you discuss any questions you have with your health care provider. Document Released: 12/15/2015 Document Revised: 08/07/2016 Document Reviewed: 09/19/2015 Elsevier Interactive Patient Education  2017 [place] Prevention in the Home Falls can cause injuries. They can happen to people of all ages. There are many things you can do to make your home safe and to help prevent falls. What can I do on the outside of my home? Regularly fix the edges of walkways and driveways and fix any cracks. Remove anything that might make you trip as you walk through a door, such as a raised step or threshold. Trim any bushes or trees on the path to your home. Use bright outdoor lighting. Clear any walking paths of anything that might make someone trip, such as rocks or tools. Regularly check to see if handrails are loose or broken. Make sure that both sides of any steps have handrails. Any raised decks and porches should have guardrails on the edges. Have any leaves, snow, or ice cleared regularly. Use sand or salt on walking paths during winter. Clean up any spills in your garage right away. This includes oil or grease spills. What can I do in the bathroom? Use night lights. Install grab  bars by the toilet and in the tub and shower. Do not use towel bars as grab bars. Use non-skid mats or decals in the tub or shower. If you need to sit down in the shower, use a plastic, non-slip stool. Keep the floor dry. Clean up any water that spills on the floor as soon as it happens. Remove soap buildup in the tub or shower regularly. Attach bath mats securely with double-sided non-slip rug tape. Do not have throw rugs and other things on the floor that can make you trip. What can I do in the bedroom? Use night lights. Make sure that you have a light by your bed that is easy to reach. Do not use any sheets or blankets that are too big for your bed. They should not hang down onto the floor. Have a firm chair that has side arms. You can use this for support while you get dressed. Do not have throw rugs and other things on the floor that can make you trip. What can I do in the kitchen? Clean up any spills right away. Avoid walking on wet floors. Keep items that you  use a lot in easy-to-reach places. If you need to reach something above you, use a strong step stool that has a grab bar. Keep electrical cords out of the way. Do not use floor polish or wax that makes floors slippery. If you must use wax, use non-skid floor wax. Do not have throw rugs and other things on the floor that can make you trip. What can I do with my stairs? Do not leave any items on the stairs. Make sure that there are handrails on both sides of the stairs and use them. Fix handrails that are broken or loose. Make sure that handrails are as long as the stairways. Check any carpeting to make sure that it is firmly attached to the stairs. Fix any carpet that is loose or worn. Avoid having throw rugs at the top or bottom of the stairs. If you do have throw rugs, attach them to the floor with carpet tape. Make sure that you have a light switch at the top of the stairs and the bottom of the stairs. If you do not have them,  ask someone to add them for you. What else can I do to help prevent falls? Wear shoes that: Do not have high heels. Have rubber bottoms. Are comfortable and fit you well. Are closed at the toe. Do not wear sandals. If you use a stepladder: Make sure that it is fully opened. Do not climb a closed stepladder. Make sure that both sides of the stepladder are locked into place. Ask someone to hold it for you, if possible. Clearly mark and make sure that you can see: Any grab bars or handrails. First and last steps. Where the edge of each step is. Use tools that help you move around (mobility aids) if they are needed. These include: Canes. Walkers. Scooters. Crutches. Turn on the lights when you go into a dark area. Replace any light bulbs as soon as they burn out. Set up your furniture so you have a clear path. Avoid moving your furniture around. If any of your floors are uneven, fix them. If there are any pets around you, be aware of where they are. Review your medicines with your doctor. Some medicines can make you feel dizzy. This can increase your chance of falling. Ask your doctor what other things that you can do to help prevent falls. This information is not intended to replace advice given to you by your health care provider. Make sure you discuss any questions you have with your health care provider. Document Released: 09/14/2009 Document Revised: 04/25/2016 Document Reviewed: 12/23/2014 Elsevier Interactive Patient Education  2017 [employer].

## 2022-10-22 NOTE — Progress Notes (Signed)
Subjective:   Jaime Mccarthy is a 81 y.o. female who presents for Medicare Annual (Subsequent) preventive examination.  Review of Systems    Virtual Visit via Telephone Note  I connected with  Jaime Mccarthy on 10/22/22 at  8:15 AM EST by telephone and verified that I am speaking with the correct person using two identifiers.  Location: Patient: Home Provider: Office Persons participating in the virtual visit: patient/Nurse Health Advisor   I discussed the limitations, risks, security and privacy concerns of performing an evaluation and management service by telephone and the availability of in person appointments. The patient expressed understanding and agreed to proceed.  Interactive audio and video telecommunications were attempted between this nurse and patient, however failed, due to patient having technical difficulties OR patient did not have access to video capability.  We continued and completed visit with audio only.  Some vital signs may be absent or patient reported.   Criselda Peaches, LPN  Cardiac Risk Factors include: advanced age (>31mn, >>40women);hypertension     Objective:    Today's Vitals   There is no height or weight on file to calculate BMI.     10/22/2022    8:34 AM 03/20/2017   10:11 AM 12/05/2015    9:59 AM 10/25/2014   10:00 AM  Advanced Directives  Does Patient Have a Medical Advance Directive? Yes Yes Yes No  Type of [place] will [place] will   Does patient want to make changes to medical advance directive? No - Patient declined     Copy of [place] Chart? Yes - validated most recent copy scanned in chart (See row information) No - copy requested      Current Medications (verified) Outpatient Encounter Medications as of 10/22/2022  Medication Sig   aspirin 325 MG tablet Take 325 mg by mouth 2 (two) times a  week.   bismuth subsalicylate (PEPTO BISMOL) 262 MG/15ML suspension Take 30 mLs by mouth daily as needed.   cholecalciferol (VITAMIN D) 1000 units tablet Take 2,000 Units by mouth daily.   diphenhydrAMINE HCl ([facility] ALLERGY PO) Take 1 tablet by mouth daily as needed.   ezetimibe (ZETIA) 10 MG tablet Take 1 tablet (10 mg total) by mouth daily.   hydrochlorothiazide (HYDRODIURIL) 25 MG tablet Take 1 tablet (25 mg total) by mouth daily.   metoprolol succinate (TOPROL-XL) 25 MG 24 hr tablet Take 1 tablet (25 mg total) by mouth daily.   MULTIPLE VITAMIN PO Take by mouth daily.   psyllium (METAMUCIL) 58.6 % powder Take 1 packet by mouth daily.   No facility-administered encounter medications on file as of 10/22/2022.    Allergies (verified) Patient has no known allergies.   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   Past Surgical History:  Procedure Laterality Date   BREAST SURGERY     left breast needle biopsy negative   GALLBLADDER SURGERY     Family History  Problem Relation Age of Onset   COPD Mother    Cancer Mother        breast Ca   Heart disease Father  CAD   Cancer Father        lung CA   Depression Daughter    Heart disease Maternal Grandmother        heart failure   Social History   Socioeconomic History   Marital status: Married    Spouse name: Not on file   Number of children: 1   Years of education: 14   Highest education level: Not on file  Occupational History    Employer: [employer]  Tobacco Use   Smoking status: Former   Smokeless tobacco: Never  Scientific laboratory technician Use: Never used  Substance and Sexual Activity   Alcohol use: No    Comment: occasional   Drug use: No   Sexual activity: Not on file  Other Topics  Concern   Not on file  Social History Narrative   Patient drinks about 2 cups of caffeine daily.   Patient is right handed.    Social Determinants of Health   Financial Resource Strain: Low Risk  (10/22/2022)   Overall Financial Resource Strain (CARDIA)    Difficulty of Paying Living Expenses: Not hard at all  Food Insecurity: No Food Insecurity (10/22/2022)   Hunger Vital Sign    Worried About Running Out of Food in the Last Year: Never true    Ran Out of Food in the Last Year: Never true  Transportation Needs: No Transportation Needs (10/22/2022)   PRAPARE - Hydrologist (Medical): No    Lack of Transportation (Non-Medical): No  Physical Activity: Inactive (10/22/2022)   Exercise Vital Sign    Days of Exercise per Week: 0 days    Minutes of Exercise per Session: 0 min  Stress: No Stress Concern Present (10/22/2022)   [place]    Feeling of Stress : Not at all  Social Connections: [place] (10/22/2022)   Social Connection and Isolation Panel [NHANES]    Frequency of Communication with Friends and Family: More than three times a week    Frequency of Social Gatherings with Friends and Family: More than three times a week    Attends Religious Services: More than 4 times per year    Active Member of [employer] or Organizations: Yes    Attends Music therapist: More than 4 times per year    Marital Status: Married    Tobacco Counseling Counseling given: Not Answered   Clinical Intake:  Pre-visit preparation completed: No  Pain : No/denies pain     BMI - recorded:  Administrator, Civil Service) Nutritional Status:  (N/A) Nutritional Risks: None Diabetes: No  How often do you need to have someone help you when you read instructions, pamphlets, or other written materials from your doctor or pharmacy?: 1 - Never  Diabetic? No  Interpreter Needed?: No  Information  entered by :: Rolene Arbour LPN   Activities of Daily Living    10/22/2022    8:30 AM  In your present state of health, do you have any difficulty performing the following activities:  Hearing? 0  Vision? 0  Difficulty concentrating or making decisions? 0  Walking or climbing stairs? 1  Comment Wheelchair  Dressing or bathing? 0  Doing errands, shopping? 0  Preparing Food and eating ? N  Using the Toilet? N  In the past six months, have you accidently leaked urine? N  Do you have problems with loss of bowel control? Y  Comment Wears depends  followed by PCP  Managing your Medications? N  Managing your Finances? N  Housekeeping or managing your Housekeeping? N    Patient Care Team: Tower, Wynelle Fanny, MD as PCP - General Thelma Comp, [place] as Consulting Physician (Optometry)  Indicate any recent Medical Services you may have received from other than [facility] providers in the past year (date may be approximate).     Assessment:   This is a routine wellness examination for Jaime Mccarthy.  Hearing/Vision screen Hearing Screening - Comments:: Denies hearing difficulties   Vision Screening - Comments:: Wears rx glasses - up to date with routine eye exams with  [facility]  Dietary issues and exercise activities discussed: Current Exercise Habits: The patient does not participate in regular exercise at present, Exercise limited by: neurologic condition(s)   Goals Addressed               This Visit's Progress     Stay healthy (pt-stated)         Depression Screen    10/22/2022    8:29 AM 09/19/2021    9:52 AM 09/15/2020    9:29 AM 09/08/2019    9:20 AM 03/24/2018   10:10 AM 03/20/2017   10:10 AM 03/19/2016    9:34 AM  PHQ 2/9 Scores  PHQ - 2 Score 0 0 0 0 0 0 0  PHQ- 9 Score     0      Fall Risk    10/22/2022    8:33 AM 09/19/2021    9:52 AM 09/15/2020    9:29 AM 09/08/2019    9:20 AM 03/24/2018   10:10 AM  Fall Risk   Falls in the past year? '1 1 1 1 '$ Yes   Comment     multiple falls; 1-2 mth due to loss of balance; denies injury  Number falls in past yr: 0 '1 1 1 2 '$ or more  Injury with Fall? 0 1 0 0 No  Risk Factor Category      High Fall Risk  Risk for fall due to : No Fall Risks    History of fall(s);Impaired balance/gait;Impaired mobility;Impaired vision  Follow up Falls prevention discussed Falls evaluation completed Falls evaluation completed Falls evaluation completed     FALL RISK PREVENTION PERTAINING TO THE HOME:  Any stairs in or around the home? Yes  If so, are there any without handrails? No  Home free of loose throw rugs in walkways, pet beds, electrical cords, etc? Yes  Adequate lighting in your home to reduce risk of falls? Yes   ASSISTIVE DEVICES UTILIZED TO PREVENT FALLS:  Life alert? No  Use of a cane, walker or w/c? Yes  Grab bars in the bathroom? Yes  Shower chair or bench in shower? Yes  Elevated toilet seat or a handicapped toilet? Yes   TIMED UP AND GO:  Was the test performed? No . Audio Visit    Cognitive Function:    03/24/2018   10:10 AM 03/20/2017   10:00 AM  MMSE - Mini Mental State Exam  Orientation to time 5 5  Orientation to Place 5 5  Registration 3 3  Attention/ Calculation 0 0  Recall 3 3  Language- name 2 objects 0 0  Language- repeat 1 1  Language- follow 3 step command 3 3  Language- read & follow direction 0 0  Write a sentence 0 0  Copy design 0 0  Total score 20 20        10/22/2022  8:34 AM  6CIT Screen  What Year? 0 points  What month? 0 points  What time? 0 points  Count back from 20 0 points  Months in reverse 0 points  Repeat phrase 0 points  Total Score 0 points    Immunizations Immunization History  Administered Date(s) Administered   Fluad Quad(high Dose 65+) 09/08/2019, 09/15/2020, 09/19/2021, 09/20/2022   Influenza Split 12/09/2012   Influenza,inj,Quad PF,6+ Mos 11/26/2013   Influenza-Unspecified 09/28/2015   PFIZER(Purple Top)SARS-COV-2 Vaccination  12/23/2019, 01/13/2020, 12/05/2020   Pneumococcal Conjugate-13 03/13/2015   Pneumococcal Polysaccharide-23 11/18/2012   Td 03/21/2005, 03/13/2015   Zoster, Live 09/05/2014      Flu Vaccine status: Up to date  Pneumococcal vaccine status: Up to date  Covid-19 vaccine status: Completed vaccines  Qualifies for Shingles Vaccine? Yes   Zostavax completed No   Shingrix Completed?: No.    Education has been provided regarding the importance of this vaccine. Patient has been advised to call insurance company to determine out of pocket expense if they have not yet received this vaccine. Advised may also receive vaccine at local pharmacy or Health Dept. Verbalized acceptance and understanding.  Screening Tests Health Maintenance  Topic Date Due   COVID-19 Vaccine (4 - 2023-24 season) 11/07/2022 (Originally 08/02/2022)   Zoster Vaccines- Shingrix (1 of 2) 01/22/2023 (Originally 11/06/1993)   MAMMOGRAM  01/23/2023   Medicare Annual Wellness (AWV)  10/23/2023   Pneumonia Vaccine 54+ Years old  Completed   INFLUENZA VACCINE  Completed   DEXA SCAN  Completed   Hepatitis C Screening  Completed   HPV VACCINES  Aged Out   COLONOSCOPY (Pts 45-1yr Insurance coverage will need to be confirmed)  Discontinued    Health Maintenance  There are no preventive care reminders to display for this patient.   Colorectal cancer screening: No longer required.   Mammogram status: Completed 01/23/22. Repeat every year  Bone Density status: Ordered Schedule for 12/08/22. Pt provided with contact info and advised to call to schedule appt.  Lung Cancer Screening: (Low Dose CT Chest recommended if Age 79-80years, 30 pack-year currently smoking OR have quit w/in 15years.) does not qualify.     Additional Screening:  Hepatitis C Screening: does qualify; Completed 09/13/22  Vision Screening: Recommended annual ophthalmology exams for early detection of glaucoma and other disorders of the eye. Is the patient  up to date with their annual eye exam?  Yes  Who is the provider or what is the name of the office in which the patient attends annual eye exams[facility] not [facility] with a provider, would they like to be referred to a provider to establish care? No .   Dental Screening: Recommended annual dental exams for proper oral hygiene  Community Resource Referral / Chronic Care Management:  CRR required this visit?  No   CCM required this visit?  No      Plan:     I have personally reviewed and noted the following in the patient's chart:   Medical and social history Use of alcohol, tobacco or illicit drugs  Current medications and supplements including opioid prescriptions. Patient is not currently taking opioid prescriptions. Functional ability and status Nutritional status Physical activity Advanced directives List of other physicians Hospitalizations, surgeries, and ER visits in previous 12 months Vitals Screenings to include cognitive, depression, and falls Referrals and appointments  In addition, I have reviewed and discussed with patient certain preventive protocols, quality metrics, and best practice recommendations. A  written personalized care plan for preventive services as well as general preventive health recommendations were provided to patient.     Criselda Peaches, LPN   75/30/1040   Nurse Notes: None

## 2022-11-03 ENCOUNTER — Telehealth: Payer: Self-pay | Admitting: Family Medicine

## 2022-11-03 DIAGNOSIS — E7800 Pure hypercholesterolemia, unspecified: Secondary | ICD-10-CM

## 2022-11-03 NOTE — Telephone Encounter (Signed)
-----   Message from Velna Hatchet, RT sent at 10/21/2022 10:35 AM EST ----- Regarding: Mon 12/4 lab Lab orders needed for appt on 12/4.  Thanks, Anda Kraft

## 2022-11-04 ENCOUNTER — Other Ambulatory Visit (INDEPENDENT_AMBULATORY_CARE_PROVIDER_SITE_OTHER): Payer: Medicare PPO

## 2022-11-04 DIAGNOSIS — E7800 Pure hypercholesterolemia, unspecified: Secondary | ICD-10-CM | POA: Diagnosis not present

## 2022-11-04 LAB — LIPID PANEL
Cholesterol: 237 mg/dL — ABNORMAL HIGH (ref 0–200)
HDL: 63.8 mg/dL (ref >39.00)
LDL Cholesterol: 134 mg/dL — ABNORMAL HIGH (ref 0–99)
NonHDL: 173.28
Total CHOL/HDL Ratio: 4
Triglycerides: 194 mg/dL — ABNORMAL HIGH (ref 0.0–149.0)
VLDL: 38.8 mg/dL (ref 0.0–40.0)

## 2022-12-11 DIAGNOSIS — H25013 Cortical age-related cataract, bilateral: Secondary | ICD-10-CM | POA: Diagnosis not present

## 2022-12-11 DIAGNOSIS — H0288B Meibomian gland dysfunction left eye, upper and lower eyelids: Secondary | ICD-10-CM | POA: Diagnosis not present

## 2022-12-11 DIAGNOSIS — H5203 Hypermetropia, bilateral: Secondary | ICD-10-CM | POA: Diagnosis not present

## 2022-12-11 DIAGNOSIS — H0288A Meibomian gland dysfunction right eye, upper and lower eyelids: Secondary | ICD-10-CM | POA: Diagnosis not present

## 2022-12-11 DIAGNOSIS — H2513 Age-related nuclear cataract, bilateral: Secondary | ICD-10-CM | POA: Diagnosis not present

## 2022-12-11 DIAGNOSIS — H524 Presbyopia: Secondary | ICD-10-CM | POA: Diagnosis not present

## 2022-12-18 ENCOUNTER — Ambulatory Visit
Admission: RE | Admit: 2022-12-18 | Discharge: 2022-12-18 | Disposition: A | Discharge status: Home / Self Care | Payer: Medicare PPO | Source: Ambulatory Visit | Attending: Family Medicine | Admitting: Family Medicine

## 2022-12-18 DIAGNOSIS — E2839 Other primary ovarian failure: Secondary | ICD-10-CM | POA: Insufficient documentation

## 2022-12-18 DIAGNOSIS — M85852 Other specified disorders of bone density and structure, left thigh: Secondary | ICD-10-CM | POA: Diagnosis not present

## 2023-03-13 ENCOUNTER — Ambulatory Visit: Payer: Medicare PPO | Admitting: Neurology

## 2023-03-21 ENCOUNTER — Encounter: Payer: Medicare PPO | Admitting: Family Medicine

## 2023-08-29 ENCOUNTER — Ambulatory Visit (INDEPENDENT_AMBULATORY_CARE_PROVIDER_SITE_OTHER): Payer: Medicare PPO | Admitting: Family Medicine

## 2023-08-29 ENCOUNTER — Telehealth: Payer: Self-pay | Admitting: Family Medicine

## 2023-08-29 VITALS — BP 164/94 | HR 85 | Temp 98.6°F | Ht 64.0 in | Wt 155.8 lb

## 2023-08-29 DIAGNOSIS — I10 Essential (primary) hypertension: Secondary | ICD-10-CM | POA: Diagnosis not present

## 2023-08-29 DIAGNOSIS — R21 Rash and other nonspecific skin eruption: Secondary | ICD-10-CM | POA: Insufficient documentation

## 2023-08-29 MED ORDER — TRIAMCINOLONE ACETONIDE 0.5 % EX CREA: 1.0000 | TOPICAL_CREAM | Freq: Two times a day (BID) | CUTANEOUS | 0 refills | Status: DC

## 2023-08-29 NOTE — Telephone Encounter (Signed)
Attempted to call, unable to leave message.  Form is in the basket up front for pt pick up.

## 2023-08-29 NOTE — Telephone Encounter (Signed)
Attempted to call pt. No answer, unable to leave voicemail.

## 2023-08-29 NOTE — Telephone Encounter (Signed)
Done and in IN box 

## 2023-08-29 NOTE — Telephone Encounter (Signed)
Handicap form placed in Dr. Royden Purl tray for signature.

## 2023-08-29 NOTE — Patient Instructions (Addendum)
Treat rash with triamcinolone twice daily.  Call if redness spreading or not improving in 2 weeks as expected.  Can use Cetaphil cleanser  to clean face. Apply Cetaphil Moisturizing CREAM  daily to skin  after washing.

## 2023-08-29 NOTE — Assessment & Plan Note (Signed)
Acute, most likely secondary to contact or allergic dermatitis.  No clear triggers.  Use Cetaphil cleanser as well as moisturizing cream. Apply triamcinolone 0.5 mg twice daily over the next 2 weeks for symptoms.  Call if not improving as expected  Return and ER precautions provided.

## 2023-08-29 NOTE — Assessment & Plan Note (Signed)
Chronic, inadequate control in office today.  Patient has not taken her hydrochlorothiazide yet.

## 2023-08-29 NOTE — Progress Notes (Addendum)
Patient ID: Jaime Mccarthy, female    DOB: Dec 06, 1940, 82 y.o.   MRN: [id number]  This visit was conducted in person.  BP (!) 164/94 (BP Location: Right Arm, Cuff Size: Normal)   Pulse 85   Temp 98.6 F (37 C) (Oral)   Ht 5\' 4"  (1.626 m)   Wt 155 lb 12.8 oz (70.7 kg)   SpO2 (!) 85%   BMI 26.74 kg/m    CC:  Chief Complaint  Patient presents with   Rash    Pt is accompanied by husband. C/o rash on around both eyes, worsen on L eye and L face. Feels dry, itchy. Sx been going on around a month or two. Using neosporin cream. No relief.     Subjective:   HPI: Jaime Mccarthy is a 82 y.o. 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 C/o rash on around both eyes, worsen on L eye and L face. Feels dry, itchy. Sx been going on around a month or two. Using neosporin cream. No relief. )   New onset rash in last 3 months... now in last week it has flared up.   Dryness, itchiness and redness  left face and now moving to eyelid. Some swelling around eye.  No eye discharge.  No fever.     She has been applying neosporin.    No new meds, no new exposures. No new foods, no new meds. Just using water to wash face.  No  past skin issues.  Relevant past medical, surgical, family and social history reviewed and updated as indicated. Interim medical history since our last visit reviewed. Allergies and medications reviewed and updated. Outpatient Medications Prior to Visit  Medication Sig Dispense Refill   aspirin 325 MG tablet Take 325 mg by mouth 2 (two) times a week.     bismuth subsalicylate (PEPTO BISMOL) 262 MG/15ML suspension Take 30 mLs by mouth daily as needed.     cholecalciferol (VITAMIN D) 1000 units tablet Take 2,000 Units by mouth daily.     diphenhydrAMINE HCl ([employer] ALLERGY PO) Take 1 tablet by mouth daily as needed.     metoprolol succinate (TOPROL-XL) 25 MG 24 hr tablet Take 1 tablet (25 mg  total) by mouth daily. 90 tablet 3   MULTIPLE VITAMIN PO Take by mouth daily.     psyllium (METAMUCIL) 58.6 % powder Take 1 packet by mouth daily. gummy     ezetimibe (ZETIA) 10 MG tablet Take 1 tablet (10 mg total) by mouth daily. (Patient not taking: Reported on 08/29/2023) 90 tablet 3   hydrochlorothiazide (HYDRODIURIL) 25 MG tablet Take 1 tablet (25 mg total) by mouth daily. (Patient not taking: Reported on 08/29/2023) 90 tablet 3   No facility-administered medications prior to visit.     Per HPI unless specifically indicated in ROS section below Review of Systems  Constitutional:  Negative for fatigue and fever.  HENT:  Negative for congestion.   Eyes:  Negative for pain.  Respiratory:  Negative for cough and shortness of breath.   Cardiovascular:  Negative for chest pain, palpitations and leg swelling.  Gastrointestinal:  Negative for abdominal pain.  Genitourinary:  Negative for dysuria and vaginal bleeding.  Musculoskeletal:  Negative for back pain.  Skin:  Positive for rash.  Neurological:  Negative for syncope, light-headedness and headaches.  Psychiatric/Behavioral:  Negative for dysphoric mood.    Objective:  BP Marland Kitchen)  164/94 (BP Location: Right Arm, Cuff Size: Normal)   Pulse 85   Temp 98.6 F (37 C) (Oral)   Ht 5\' 4"  (1.626 m)   Wt 155 lb 12.8 oz (70.7 kg)   SpO2 (!) 85%   BMI 26.74 kg/m   Wt Readings from Last 3 Encounters:  08/29/23 155 lb 12.8 oz (70.7 kg)  03/06/21 167 lb (75.8 kg)  12/16/19 179 lb (81.2 kg)      Physical Exam Constitutional:      General: She is not in acute distress.    Appearance: Normal appearance. She is well-developed. She is not ill-appearing or toxic-appearing.     Comments: In wheelchair  HENT:     Head: Normocephalic.     Right Ear: Hearing, tympanic membrane, ear canal and external ear normal. Tympanic membrane is not erythematous, retracted or bulging.     Left Ear: Hearing, tympanic membrane, ear canal and external ear normal.  Tympanic membrane is not erythematous, retracted or bulging.     Nose: No mucosal edema or rhinorrhea.     Right Sinus: No maxillary sinus tenderness or frontal sinus tenderness.     Left Sinus: No maxillary sinus tenderness or frontal sinus tenderness.     Mouth/Throat:     Mouth: Oropharynx is clear and moist and mucous membranes are normal.     Pharynx: Uvula midline.  Eyes:     General: Lids are normal. Lids are everted, no foreign bodies appreciated.     Extraocular Movements: EOM normal.     Conjunctiva/sclera: Conjunctivae normal.     Pupils: Pupils are equal, round, and reactive to light.  Neck:     Thyroid: No thyroid mass or thyromegaly.     Vascular: No carotid bruit.     Trachea: Trachea normal.  Cardiovascular:     Rate and Rhythm: Normal rate and regular rhythm.     Pulses: Normal pulses.     Heart sounds: Normal heart sounds, S1 normal and S2 normal. No murmur heard.    No friction rub. No gallop.  Pulmonary:     Effort: Pulmonary effort is normal. No tachypnea or respiratory distress.     Breath sounds: Normal breath sounds. No decreased breath sounds, wheezing, rhonchi or rales.  Abdominal:     General: Bowel sounds are normal.     Palpations: Abdomen is soft.     Tenderness: There is no abdominal tenderness.  Musculoskeletal:     Cervical back: Normal range of motion and neck supple.  Skin:    General: Skin is warm, dry and intact.     Findings: No rash.  Neurological:     Mental Status: She is alert.     Comments: Nonambulatory, slurred speech and abnormal movements  chronic and stable at baseline.  Psychiatric:        Mood and Affect: Mood is not anxious or depressed.        Speech: Speech normal.        Behavior: Behavior normal. Behavior is cooperative.        Thought Content: Thought content normal.        Cognition and Memory: Cognition and memory normal.        Judgment: Judgment normal.       Results for orders placed or performed in visit on  11/04/22  Lipid panel  Result Value Ref Range   Cholesterol 237 (H) 0 - 200 mg/dL   Triglycerides 086.5 (H) 0.0 - 149.0 mg/dL  HDL 63.80 >39.00 mg/dL   VLDL 96.0 0.0 - 45.4 mg/dL   LDL Cholesterol 098 (H) 0 - 99 mg/dL   Total CHOL/HDL Ratio 4    NonHDL 173.28     Assessment and Plan  Rash Assessment & Plan: Acute, most likely secondary to contact or allergic dermatitis.  No clear triggers.  Use Cetaphil cleanser as well as moisturizing cream. Apply triamcinolone 0.5 mg twice daily over the next 2 weeks for symptoms.  Call if not improving as expected  Return and ER precautions provided.   Essential hypertension Assessment & Plan: Chronic, inadequate control in office today.  Patient has not taken her hydrochlorothiazide yet.   Other orders -     Triamcinolone Acetonide; Apply 1 Application topically 2 (two) times daily.  Dispense: 30 g; Refill: 0    No follow-ups on file.   Kerby Nora, MD

## 2023-08-29 NOTE — Telephone Encounter (Signed)
[id number]

## 2023-08-29 NOTE — Telephone Encounter (Signed)
Patient dropped off document Handicap Placard, to be filled out by provider. Patient requested to send it back via Call Patient to pick up within 5-days. Document is located in providers tray at front office.Please advise at [facility] [phone]

## 2023-09-17 ENCOUNTER — Telehealth: Payer: Self-pay | Admitting: Family Medicine

## 2023-09-17 DIAGNOSIS — I10 Essential (primary) hypertension: Secondary | ICD-10-CM

## 2023-09-17 DIAGNOSIS — E559 Vitamin D deficiency, unspecified: Secondary | ICD-10-CM

## 2023-09-17 DIAGNOSIS — R7989 Other specified abnormal findings of blood chemistry: Secondary | ICD-10-CM

## 2023-09-17 DIAGNOSIS — E7800 Pure hypercholesterolemia, unspecified: Secondary | ICD-10-CM

## 2023-09-17 NOTE — Telephone Encounter (Signed)
-----   Message from Vincenza Hews sent at 09/03/2023  1:59 PM EDT ----- Regarding: Labs Fri. 09/19/23 Hello,  Patient is coming in for CPE labs on Fri. 09/19/23. Can we get orders please.   Thanks

## 2023-09-19 ENCOUNTER — Other Ambulatory Visit (INDEPENDENT_AMBULATORY_CARE_PROVIDER_SITE_OTHER): Payer: Medicare PPO

## 2023-09-19 DIAGNOSIS — E559 Vitamin D deficiency, unspecified: Secondary | ICD-10-CM | POA: Diagnosis not present

## 2023-09-19 DIAGNOSIS — E7800 Pure hypercholesterolemia, unspecified: Secondary | ICD-10-CM

## 2023-09-19 DIAGNOSIS — R7989 Other specified abnormal findings of blood chemistry: Secondary | ICD-10-CM

## 2023-09-19 DIAGNOSIS — I10 Essential (primary) hypertension: Secondary | ICD-10-CM | POA: Diagnosis not present

## 2023-09-19 LAB — CBC WITH DIFFERENTIAL/PLATELET
Basophils Absolute: 0 10*3/uL (ref 0.0–0.1)
Basophils Relative: 0.4 % (ref 0.0–3.0)
Eosinophils Absolute: 0.1 10*3/uL (ref 0.0–0.7)
Eosinophils Relative: 1.1 % (ref 0.0–5.0)
HCT: 45.9 % (ref 36.0–46.0)
Hemoglobin: 14.8 g/dL (ref 12.0–15.0)
Lymphocytes Relative: 30.6 % (ref 12.0–46.0)
Lymphs Abs: 2.4 10*3/uL (ref 0.7–4.0)
MCHC: 32.2 g/dL (ref 30.0–36.0)
MCV: 89.9 fL (ref 78.0–100.0)
Monocytes Absolute: 0.7 10*3/uL (ref 0.1–1.0)
Monocytes Relative: 8.6 % (ref 3.0–12.0)
Neutro Abs: 4.6 10*3/uL (ref 1.4–7.7)
Neutrophils Relative %: 59.3 % (ref 43.0–77.0)
Platelets: 314 10*3/uL (ref 150.0–400.0)
RBC: 5.11 Mil/uL (ref 3.87–5.11)
RDW: 14.4 % (ref 11.5–15.5)
WBC: 7.8 10*3/uL (ref 4.0–10.5)

## 2023-09-19 LAB — LIPID PANEL
Cholesterol: 289 mg/dL — ABNORMAL HIGH (ref 0–200)
HDL: 60.8 mg/dL (ref >39.00)
LDL Cholesterol: 189 mg/dL — ABNORMAL HIGH (ref 0–99)
NonHDL: 228.33
Total CHOL/HDL Ratio: 5
Triglycerides: 196 mg/dL — ABNORMAL HIGH (ref 0.0–149.0)
VLDL: 39.2 mg/dL (ref 0.0–40.0)

## 2023-09-19 LAB — COMPREHENSIVE METABOLIC PANEL
ALT: 18 U/L (ref 0–35)
AST: 20 U/L (ref 0–37)
Albumin: 4.4 g/dL (ref 3.5–5.2)
Alkaline Phosphatase: 85 U/L (ref 39–117)
BUN: 10 mg/dL (ref 6–23)
CO2: 36 meq/L — ABNORMAL HIGH (ref 19–32)
Calcium: 10.9 mg/dL — ABNORMAL HIGH (ref 8.4–10.5)
Chloride: 94 meq/L — ABNORMAL LOW (ref 96–112)
Creatinine, Ser: 0.76 mg/dL (ref 0.40–1.20)
GFR: 74.29 mL/min (ref >60.00)
Glucose, Bld: 88 mg/dL (ref 70–99)
Potassium: 4 meq/L (ref 3.5–5.1)
Sodium: 141 meq/L (ref 135–145)
Total Bilirubin: 0.6 mg/dL (ref 0.2–1.2)
Total Protein: 7.1 g/dL (ref 6.0–8.3)

## 2023-09-19 LAB — TSH: TSH: 3.14 u[IU]/mL (ref 0.35–5.50)

## 2023-09-19 LAB — VITAMIN D 25 HYDROXY (VIT D DEFICIENCY, FRACTURES): VITD: 39.79 ng/mL (ref 30.00–100.00)

## 2023-09-19 LAB — T4, FREE: Free T4: 0.74 ng/dL (ref 0.60–1.60)

## 2023-09-26 ENCOUNTER — Encounter: Payer: Medicare PPO | Admitting: Family Medicine

## 2023-09-29 NOTE — Progress Notes (Unsigned)
Subjective:    Patient ID: Jaime Mccarthy, female    DOB: Jun 07, 1941, 82 y.o.   MRN: [id number]  HPI  Here for health maintenance exam and to review chronic medical problems   Wt Readings from Last 3 Encounters:  08/29/23 155 lb 12.8 oz (70.7 kg)  03/06/21 167 lb (75.8 kg)  12/16/19 179 lb (81.2 kg)    Appetite is down with age  Eats less in general   Vitals:   09/30/23 1052  BP: 130/72  Pulse: 68  Temp: 98.7 F (37.1 C)  SpO2: 98%    Immunization History  Administered Date(s) Administered   Fluad Quad(high Dose 65+) 09/08/2019, 09/15/2020, 09/19/2021, 09/20/2022   Fluad Trivalent(High Dose 65+) 09/30/2023   Influenza Split 12/09/2012   Influenza,inj,Quad PF,6+ Mos 11/26/2013   Influenza-Unspecified 09/28/2015   PFIZER(Purple Top)SARS-COV-2 Vaccination 12/23/2019, 01/13/2020, 12/05/2020   Pneumococcal Conjugate-13 03/13/2015   Pneumococcal Polysaccharide-23 11/18/2012   Td 03/21/2005, 03/13/2015   Zoster, Live 09/05/2014    Health Maintenance Due  Topic Date Due   Medicare Annual Wellness (AWV)  10/23/2023   Feels pretty good  No big changes   Shingrix-planning to get that this year   Flu shot -given today   Mammogram 01/2022 -declines any more screening at this time  Self breast exam: no lumps   Gyn health No problems    Colon cancer screening  colonoscopy 11/2003 Declines further screening due to neuro muscular status     Bone health  Dexa 12/2022  osteopenia  Falls-last week (prone to them) , was walking to bathroom with walker , went over to the side  Tries to be more careful / with her ataxia  Fractures- none  Supplements  Last vitamin D Lab Results  Component Value Date   VD25OH 39.79 09/19/2023  Taking 2000 international units daily   Exercise  None Wants to start a chair program    Mood    10/22/2022    8:29 AM 09/19/2021    9:52 AM 09/15/2020    9:29 AM 09/08/2019    9:20 AM 03/24/2018   10:10 AM  Depression screen PHQ 2/9   Decreased Interest 0 0 0 0 0  Down, Depressed, Hopeless 0 0 0 0 0  PHQ - 2 Score 0 0 0 0 0  Altered sleeping     0  Tired, decreased energy     0  Change in appetite     0  Feeling bad or failure about yourself      0  Trouble concentrating     0  Moving slowly or fidgety/restless     0  Suicidal thoughts     0  PHQ-9 Score     0  Difficult doing work/chores     Not difficult at all    HTN bp is stable today  No cp or palpitations or headaches or edema  No side effects to medicines  BP Readings from Last 3 Encounters:  09/30/23 130/72  08/29/23 (!) 164/94  09/20/22 122/68    Hydrochlorothiazide 25 mg daily  Metoprolol xl 25 mg daily   Blood pressure was up when she forgot to take hydrochlorothiazide (is back on track now_   Pulse Readings from Last 3 Encounters:  09/30/23 68  08/29/23 85  09/20/22 (!) 52   Spinocerebellar ataxia  Has appointment in [place] for neuro  Her condition is progressive     Lab Results  Component Value Date   TSH 3.14 09/19/2023  Not elevated this time   Hyperlipidemia Lab Results  Component Value Date   CHOL 289 (H) 09/19/2023   CHOL 237 (H) 11/04/2022   CHOL 269 (H) 09/13/2022   Lab Results  Component Value Date   HDL 60.80 09/19/2023   HDL 63.80 11/04/2022   HDL 61.70 09/13/2022   Lab Results  Component Value Date   LDLCALC 189 (H) 09/19/2023   LDLCALC 134 (H) 11/04/2022   LDLCALC 172 (H) 09/13/2022   Lab Results  Component Value Date   TRIG 196.0 (H) 09/19/2023   TRIG 194.0 (H) 11/04/2022   TRIG 178.0 (H) 09/13/2022   Lab Results  Component Value Date   CHOLHDL 5 09/19/2023   CHOLHDL 4 11/04/2022   CHOLHDL 4 09/13/2022   Lab Results  Component Value Date   LDLDIRECT 186.5 11/18/2013   LDLDIRECT 147.0 09/25/2012   LDLDIRECT 133.8 03/03/2012    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 Tried zetia (it did help) but stopped it due to side effects  She thought it made her hands weaker and less coordinated and her left leg  did not move as well   At this point in her life with neuro issues/ cholesterol is not a priority list   The 10-year ASCVD risk score (Arnett DK, et al., 2019) is: 31.7%   Values used to calculate the score:     Age: 24 years     Sex: Female     Is Non-Hispanic African American: No     Diabetic: No     Tobacco smoker: No     Systolic Blood Pressure: 130 mmHg     Is BP treated: Yes     HDL Cholesterol: 60.8 mg/dL     Total Cholesterol: 289 mg/dL  She needs to watch diet more  Husband makes meals and he is incapable of fixing healthy food  Lot of take out  He refuses to prep healthy food   Other labs  Lab Results  Component Value Date   WBC 7.8 09/19/2023   HGB 14.8 09/19/2023   HCT 45.9 09/19/2023   MCV 89.9 09/19/2023   PLT 314.0 09/19/2023   Lab Results  Component Value Date   ALT 18 09/19/2023   AST 20 09/19/2023   ALKPHOS 85 09/19/2023   BILITOT 0.6 09/19/2023      Patient Active Problem List   Diagnosis Date Noted   Elevated TSH 02/28/2021   Osteopenia 04/29/2017   Estrogen deficiency 03/24/2017   Routine general medical examination at a health care facility 03/19/2016   Cerumen impaction 03/19/2016   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   Encounter for Medicare annual wellness exam 11/26/2013   Colon cancer screening 11/26/2013   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   Abnormality of gait 01/26/2013   Vitamin D deficiency 01/04/2009   DISORDER, 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   FIBROIDS, UTERUS 03/16/2007   HYPERCHOLESTEROLEMIA 03/16/2007   Essential hypertension 03/16/2007   Allergic rhinitis 03/16/2007   FIBROCYSTIC BREAST DISEASE 03/16/2007   History of cardiovascular disorder 03/16/2007   Past Medical History:  Diagnosis Date   Allergy    allergic rhinitis seasonal   Ataxia    degenerative cerebellar   Degenerative arthritis     Left knee   Dyslipidemia    Gait disorder    Hyperlipidemia    Hypertension    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   Past  Surgical History:  Procedure Laterality Date   BREAST SURGERY     left breast needle biopsy negative   GALLBLADDER  SURGERY     Social History   Tobacco Use   Smoking status: Former   Smokeless tobacco: Never  Vaping Use   Vaping status: Never Used  Substance Use Topics   Alcohol use: No    Comment: occasional   Drug use: No   Family History  Problem Relation Age of Onset   COPD Mother    Cancer Mother        breast Ca   Heart disease Father        CAD   Cancer Father        lung CA   Depression Daughter    Heart disease Maternal Grandmother        heart failure   Allergies  Allergen Reactions   Statins     Ataxia and weakness/myopathy    Zetia [Ezetimibe]     Weakness    Current Outpatient Medications on File Prior to Visit  Medication Sig Dispense Refill   aspirin 325 MG tablet Take 325 mg by mouth 2 (two) times a week.     bismuth subsalicylate (PEPTO BISMOL) 262 MG/15ML suspension Take 30 mLs by mouth daily as needed.     cholecalciferol (VITAMIN D) 1000 units tablet Take 2,000 Units by mouth daily.     CVS FIBER GUMMIES PO Take 4 tablets by mouth daily.     diphenhydrAMINE HCl ([employer]S ALLERGY PO) Take 1 tablet by mouth daily as needed.     MULTIPLE VITAMIN PO Take by mouth daily.     triamcinolone cream (KENALOG) 0.5 % Apply 1 Application topically 2 (two) times daily. 30 g 0   No current facility-administered medications on file prior to visit.    Review of Systems  Constitutional:  Negative for activity change, appetite change, fatigue, fever and unexpected weight change.  HENT:  Negative for congestion, ear pain, rhinorrhea, sinus pressure and sore throat.   Eyes:  Negative for pain, redness and visual disturbance.  Respiratory:  Negative for cough, shortness of breath and wheezing.   Cardiovascular:  Negative for chest pain and palpitations.  Gastrointestinal:  Negative for abdominal pain, blood in stool, constipation and diarrhea.  Endocrine: Negative  for polydipsia and polyuria.  Genitourinary:  Negative for dysuria, frequency and urgency.  Musculoskeletal:  Positive for arthralgias. Negative for back pain and myalgias.  Skin:  Negative for pallor and rash.  Allergic/Immunologic: Negative for environmental allergies.  Neurological:  Positive for dizziness, speech difficulty and weakness. Negative for tremors, seizures, syncope, facial asymmetry, light-headedness, numbness and headaches.       Baseline ataxia  Impaired coordination of limbs Some issues with speech  No swallowing problems   Hematological:  Negative for adenopathy. Does not bruise/bleed easily.  Psychiatric/Behavioral:  Negative for decreased concentration and dysphoric mood. The patient is not nervous/anxious.        Objective:   Physical Exam Constitutional:      General: She is not in acute distress.    Appearance: Normal appearance. She is well-developed and normal weight. She is not ill-appearing or diaphoretic.  HENT:     Head: Normocephalic and atraumatic.     Right Ear: Tympanic membrane, ear canal and external ear normal. There is impacted cerumen.     Left Ear: Tympanic membrane, ear canal and external ear normal. There is impacted cerumen.     Ears:     Comments: Partial cerumen in both ear canals  Able to visualize TMs    Nose: Nose normal. No congestion.  Mouth/Throat:     Mouth: Mucous membranes are moist.     Pharynx: Oropharynx is clear. No posterior oropharyngeal erythema.  Eyes:     General: No scleral icterus.    Extraocular Movements: Extraocular movements intact.     Conjunctiva/sclera: Conjunctivae normal.     Pupils: Pupils are equal, round, and reactive to light.  Neck:     Thyroid: No thyromegaly.     Vascular: No carotid bruit or JVD.  Cardiovascular:     Rate and Rhythm: Normal rate and regular rhythm.     Pulses: Normal pulses.     Heart sounds: Normal heart sounds.     No gallop.  Pulmonary:     Effort: Pulmonary effort is  normal. No respiratory distress.     Breath sounds: Normal breath sounds. No wheezing or rales.     Comments: Good air exch Chest:     Chest wall: No tenderness.  Abdominal:     General: Bowel sounds are normal. There is no distension or abdominal bruit.     Palpations: Abdomen is soft. There is no mass.     Tenderness: There is no abdominal tenderness.     Hernia: No hernia is present.  Genitourinary:    Comments: Breast exam: No mass, nodules, thickening, tenderness, bulging, retraction, inflamation, nipple discharge or skin changes noted.  No axillary or clavicular LA.     Musculoskeletal:        General: No tenderness. Normal range of motion.     Cervical back: Normal range of motion and neck supple. No rigidity. No muscular tenderness.     Right lower leg: No edema.     Left lower leg: No edema.     Comments: No kyphosis   Lymphadenopathy:     Cervical: No cervical adenopathy.  Skin:    General: Skin is warm and dry.     Coloration: Skin is not pale.     Findings: No erythema or rash.  Neurological:     Mental Status: She is alert. Mental status is at baseline.     Cranial Nerves: No cranial nerve deficit.     Sensory: No sensory deficit.     Motor: Weakness present. No abnormal muscle tone.     Coordination: Coordination normal.     Gait: Gait normal.     Deep Tendon Reflexes: Reflexes are normal and symmetric.     Comments: Baseline weak with poor coordination and ataxia  Grip is not full  Speech is slow but no worse than a year ago     Psychiatric:        Mood and Affect: Mood normal.        Cognition and Memory: Cognition and memory normal.           Assessment & Plan:   Problem List Items Addressed This Visit       Cardiovascular and Mediastinum   Essential hypertension    bp in fair control at this time  BP Readings from Last 1 Encounters:  09/30/23 130/72   No changes needed Most recent labs reviewed  Disc lifstyle change with low sodium diet  and exercise  Plan to continue hctz 25 mg daily  Metoprolol xl 25 mg daily       Relevant Medications   hydrochlorothiazide (HYDRODIURIL) 25 MG tablet   metoprolol succinate (TOPROL-XL) 25 MG 24 hr tablet     Nervous and Auditory   Spinocerebellar ataxia (HCC) (Chronic)    Progressive  Sees neurology  Declines PT  Enc home exercise program Fall prevention disc Wheelchair /walker         Musculoskeletal and Integument   Osteopenia    Dexa 12/2022  Several falls (ataxia) , no fractures  Discussed fall prevention, supplements and exercise for bone density   Interested in a chair exercise program if able  D level is in therap range         Other   Colon cancer screening    Declines       Elevated TSH    Normal tsh this time Lab Results  Component Value Date   TSH 3.14 09/19/2023         HYPERCHOLESTEROLEMIA    Cholesterol is up significantly  Disc goals for lipids and reasons to control them Rev last labs with pt Rev low sat fat diet in detail  LDL of 189 off zetia  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  Intol of zetia-weakness  Do wonder if she would qualify for ins cov of PCYK9  Will refer to pharmacy for a consult   Diet is not in her control Unfortunately husband Refuses to cook much / does not prep healthy food and they eat carry out a lot  Urged her to order healthier foods  She cannot prep food due to her neuro status       Relevant Medications   hydrochlorothiazide (HYDRODIURIL) 25 MG tablet   metoprolol succinate (TOPROL-XL) 25 MG 24 hr tablet   Routine general medical examination at a health care facility - Primary    Reviewed health habits including diet and exercise and skin cancer prevention Reviewed appropriate screening tests for age  Also reviewed health mt list, fam hx and immunization status , as well as social and family history   See HPI Labs reviewed and ordered Plans to get shingrix vaccine soon  Flu shot given  Declines breast and colon  cancer screening  Dexa utd 12/2022 Discussed fall prevention, supplements and exercise for bone density  PHQ last was 0       Vitamin D deficiency    Last vitamin D Lab Results  Component Value Date   VD25OH 39.79 09/19/2023   Vitamin D level is therapeutic with current supplementation Disc importance of this to bone and overall health       Other Visit Diagnoses     Need for influenza vaccination       Relevant Orders   Flu Vaccine Trivalent High Dose (Fluad) (Completed)

## 2023-09-30 ENCOUNTER — Encounter: Payer: Self-pay | Admitting: Family Medicine

## 2023-09-30 ENCOUNTER — Ambulatory Visit (INDEPENDENT_AMBULATORY_CARE_PROVIDER_SITE_OTHER): Payer: Medicare PPO | Admitting: Family Medicine

## 2023-09-30 VITALS — BP 130/72 | HR 68 | Temp 98.7°F

## 2023-09-30 DIAGNOSIS — Z0000 Encounter for general adult medical examination without abnormal findings: Secondary | ICD-10-CM | POA: Diagnosis not present

## 2023-09-30 DIAGNOSIS — M85859 Other specified disorders of bone density and structure, unspecified thigh: Secondary | ICD-10-CM | POA: Diagnosis not present

## 2023-09-30 DIAGNOSIS — Z1211 Encounter for screening for malignant neoplasm of colon: Secondary | ICD-10-CM | POA: Diagnosis not present

## 2023-09-30 DIAGNOSIS — I10 Essential (primary) hypertension: Secondary | ICD-10-CM

## 2023-09-30 DIAGNOSIS — E7800 Pure hypercholesterolemia, unspecified: Secondary | ICD-10-CM | POA: Diagnosis not present

## 2023-09-30 DIAGNOSIS — R7989 Other specified abnormal findings of blood chemistry: Secondary | ICD-10-CM

## 2023-09-30 DIAGNOSIS — Z23 Encounter for immunization: Secondary | ICD-10-CM | POA: Diagnosis not present

## 2023-09-30 DIAGNOSIS — E559 Vitamin D deficiency, unspecified: Secondary | ICD-10-CM | POA: Diagnosis not present

## 2023-09-30 DIAGNOSIS — G118 Other hereditary ataxias: Secondary | ICD-10-CM | POA: Diagnosis not present

## 2023-09-30 MED ORDER — METOPROLOL SUCCINATE ER 25 MG PO TB24: 25.0000 mg | ORAL_TABLET | Freq: Every day | ORAL | 3 refills | Status: DC

## 2023-09-30 MED ORDER — HYDROCHLOROTHIAZIDE 25 MG PO TABS: 25.0000 mg | ORAL_TABLET | Freq: Every day | ORAL | 3 refills | Status: DC

## 2023-09-30 NOTE — Assessment & Plan Note (Signed)
Reviewed health habits including diet and exercise and skin cancer prevention Reviewed appropriate screening tests for age  Also reviewed health mt list, fam hx and immunization status , as well as social and family history   See HPI Labs reviewed and ordered Plans to get shingrix vaccine soon  Flu shot given  Declines breast and colon cancer screening  Dexa utd 12/2022 Discussed fall prevention, supplements and exercise for bone density  PHQ last was 0

## 2023-09-30 NOTE — Assessment & Plan Note (Signed)
Progressive  Sees neurology  Declines PT  Enc home exercise program Fall prevention disc Wheelchair Jaime Mccarthy

## 2023-09-30 NOTE — Assessment & Plan Note (Signed)
Declines

## 2023-09-30 NOTE — Assessment & Plan Note (Signed)
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

## 2023-09-30 NOTE — Assessment & Plan Note (Signed)
bp in fair control at this time  BP Readings from Last 1 Encounters:  09/30/23 130/72   No changes needed Most recent labs reviewed  Disc lifstyle change with low sodium diet and exercise  Plan to continue hctz 25 mg daily  Metoprolol xl 25 mg daily

## 2023-09-30 NOTE — Patient Instructions (Addendum)
If you are interested in the new shingles vaccine (Shingrix) - call your local pharmacy to check on coverage and availability   Flu shot today   Consider chair exercise  Add some strength training to your routine, this is important for bone and brain health and can reduce your risk of falls and help your body use insulin properly and regulate weight  Light weights, exercise bands , and internet videos are a good way to start  Yoga (chair or regular), machines , floor exercises or a gym with machines are also good options    One of the sit down pedaling machines would be great   I put the referral in to meet with one of our pharmacists to discuss cholesterol treatment optoins  Please let us know if you don't hear in 1-2 weeks   Avoid red meat/ fried foods/ egg yolks/ fatty breakfast meats/ butter, cheese and high fat dairy/ and shellfish

## 2023-09-30 NOTE — Assessment & Plan Note (Signed)
Dexa 12/2022  Several falls (ataxia) , no fractures  Discussed fall prevention, supplements and exercise for bone density   Interested in a chair exercise program if able  D level is in therap range

## 2023-09-30 NOTE — Assessment & Plan Note (Signed)
Last vitamin D Lab Results  Component Value Date   VD25OH 39.79 09/19/2023   Vitamin D level is therapeutic with current supplementation Disc importance of this to bone and overall health

## 2023-09-30 NOTE — Assessment & Plan Note (Signed)
Normal tsh this time Lab Results  Component Value Date   TSH 3.14 09/19/2023

## 2023-10-03 ENCOUNTER — Telehealth: Payer: Self-pay

## 2023-10-03 NOTE — Progress Notes (Signed)
   Care Guide Note  10/03/2023 Name: [name] MRN: [id number] DOB: 30-Mar-1941  Referred by: Tower, Audrie Gallus, MD Reason for referral : Care Coordination (Outreach to schedule with Pharm d )   Jaime Mccarthy is a 82 y.o. year old female who is a primary care patient of Tower, Audrie Gallus, MD. [name] was referred to the pharmacist for assistance related to HLD.    Successful contact was made with the patient to discuss pharmacy services including being ready for the pharmacist to call at least 5 minutes before the scheduled appointment time, to have medication bottles and any blood sugar or blood pressure readings ready for review. The patient agreed to meet with the pharmacist via with the pharmacist via telephone visit on (date/time).  10/09/2023  Penne Lash, RMA Care Guide [facility]  [place], [place] [address] Direct Dial: [phone] [email or web address]

## 2023-10-09 ENCOUNTER — Telehealth: Payer: Self-pay | Admitting: Family Medicine

## 2023-10-09 ENCOUNTER — Other Ambulatory Visit: Payer: Medicare PPO

## 2023-10-09 DIAGNOSIS — E7800 Pure hypercholesterolemia, unspecified: Secondary | ICD-10-CM

## 2023-10-09 MED ORDER — REPATHA SURECLICK 140 MG/ML ~~LOC~~ SOAJ: 140.0000 mg | SUBCUTANEOUS | 3 refills | Status: DC

## 2023-10-09 NOTE — Telephone Encounter (Signed)
Resent electronically. 

## 2023-10-09 NOTE — Progress Notes (Signed)
10/09/2023 Name: Jaime Mccarthy MRN: [id number] DOB: 04-13-1941  Subjective  Chief Complaint  Patient presents with   Hyperlipidemia   Reason for visit: ?  Jaime Mccarthy is a 82 y.o. female who presents today for an initial pharmacotherapy visit.? They were referred by PCP for Lipid management. Pertinent PMH includes HLD, HTN.  Care Team: Primary Care Provider: Judy Pimple, MD   Date of Last Visit: with PCP on 09/30/23    Medication Access/Adherence: Prescription drug coverage: Payor: HU[employer]NA MEDICARE / Plan: HUMANA MEDICARE CHOICE PPO / Product Type: *No Product type* / .  Reports that all medications are affordable.  Current Patient Assistance: None - Patient reports household of 2 with combined annual income <~$50,000 Medication Adherence: Patient denies missing doses of their medication.  History of Present Illness: ?  Reported Lipid Regimen: ?  None  Lipid-lowering medications tried in the past:?  atorvastatin (myopathy w CK elevation) ezetimibe (pt perceived hand weakness, impairment in leg movements)  Reported Diet: Husband prepares all of her meals which often consists of fast food.  Exercise: Currently minimal (only exercise = walking to bathroom; Plans to get a elliptical/peddle type matching and 5 lb weights to strengthen arms).    Cardiovascular Risk Reduction History of clinical ASCVD? no The 10-year ASCVD risk score (Arnett DK, et al., 2019) is: 31.7% History of heart failure? no History of diabetes: No Current BMI: 26.6 kg/m2 (Ht 64 in, Wt 70.7 kg) Taking statin? intolerant; Myopathy with confirmed CK elevation Taking aspirin? not indicated;  Taking 325 mg ~2-3 times per week for pain    Taking SGLT-2i? no Taking GLP- 1 RA? no     _______________________________________________  Objective    Review of Systems:? Limited in the setting of virtual visit    Physical Examination:  Vitals:  Wt Readings from Last 3 Encounters:  08/29/23 155 lb 12.8 oz  (70.7 kg)  03/06/21 167 lb (75.8 kg)  12/16/19 179 lb (81.2 kg)   BP Readings from Last 3 Encounters:  09/30/23 130/72  08/29/23 (!) 164/94  09/20/22 122/68   Pulse Readings from Last 3 Encounters:  09/30/23 68  08/29/23 85  09/20/22 (!) 52     Labs:?   Lab Results  Component Value Date   CKTOTAL 277 (HH) 06/02/2007   Lab Results  Component Value Date   CHOL 289 (H) 09/19/2023   LDLCALC 189 (H) 09/19/2023   LDLCALC 134 (H) 11/04/2022   LDLCALC 172 (H) 09/13/2022   LDLDIRECT 186.5 11/18/2013   LDLDIRECT 147.0 09/25/2012   HDL 60.80 09/19/2023   TRIG 196.0 (H) 09/19/2023   TRIG 194.0 (H) 11/04/2022   TRIG 178.0 (H) 09/13/2022   ALT 18 09/19/2023   ALT 20 09/13/2022   AST 20 09/19/2023   AST 20 09/13/2022   Lab Results  Component Value Date   GLUCOSE 88 09/19/2023   CREATININE 0.76 09/19/2023   CREATININE 0.83 09/13/2022   CREATININE 0.71 09/19/2021   GFR 74.29 09/19/2023   GFR 67.32 09/13/2022   GFR 81.76 09/19/2021     Chemistry      Component Value Date/Time   NA 141 09/19/2023 0913   K 4.0 09/19/2023 0913   CL 94 (L) 09/19/2023 0913   CO2 36 (H) 09/19/2023 0913   BUN 10 09/19/2023 0913   CREATININE 0.76 09/19/2023 0913      Component Value Date/Time   CALCIUM 10.9 (H) 09/19/2023 0913   ALKPHOS 85 09/19/2023 0913   AST 20 09/19/2023 0913  ALT 18 09/19/2023 0913   BILITOT 0.6 09/19/2023 0913     The 10-year ASCVD risk score (Arnett DK, et al., 2019) is: 31.7%   Values used to calculate the score:     Age: 17 years     Sex: Female     Is Non-Hispanic African American: No     Diabetic: No     Tobacco smoker: No     Systolic Blood Pressure: 130 mmHg     Is BP treated: Yes     HDL Cholesterol: 60.8 mg/dL     Total Cholesterol: 289 mg/dL  Assessment and Plan:    Hyperlipidemia (primary prevention):  uncontrolled  on last lipid panel: TC 289 mg/dL, LDL 914 mg/dL, TG 782 mg/dL (95/62/13). LDL goal  <70 for primary prevention with elevated risk  . Patient is at High risk w significantly elevated ASCVD score >30% though as previously documented, with neuro issues, cholesterol is not her main priority. LDL goal <70 mg/dL though <086 mg/dL reasonable if unable to achieve due to limited tx options. Dietary modification limited, food choices are out of her control (husband does not prepare healthy food). Patient would benefit from LDL reduction and is a good candidate for PCSK9i for CV risk reduction and good LDL-reduction.  - Current Regimen: Not on lipid-lowering therapy - Previous therapies: statins (myopathy); ezetimibe (weakness) - Key risk factors include: hypertension (well-controlled), hyperlipidemia, and sedentary lifestyle - The 10-year ASCVD risk score (Arnett DK, et al., 2019) is: 31.7% indicated patient is at High risk.  Assisted patient with [employer]Well [employer] enrollment. Note, this can be used on any HLD medication that is covered by insurance.      HealthWell ID: [id number]     Start Date: 09/09/23     End Date: 09/07/24     Funds: $2500/year If we cannot get PCSK9i covered through insurance despite PA/appeal, patient should be eligible for MAP program based on her estimated income <$50,000 annually (household of 2).  Start Repatha 140 mg sq every 2 weeks Once insurance coverage confirmed from pharmacy, will schedule patient for injection training visit per request. Repeat lipid panel 4-12 weeks after first Repatha injection.   Follow Up Follow up with clinical pharmacist via phone 4 weeks to assess tolerability of first two injections. Patient given direct line for questions regarding medication therapy  Future Appointments  Date Time Provider Department Center  10/23/2023  8:15 AM [facility] ANNUAL WELLNESS VISIT 1 [facility]  12/04/2023 10:45 AM Glean Salvo, NP [facility] None  09/23/2024  9:00 AM [facility] LAB [facility]  09/30/2024 11:00 AM Tower, Audrie Gallus, MD [facility]    Loree Fee, PharmD Clinical  Pharmacist [facility] [phone]

## 2023-10-09 NOTE — Patient Instructions (Addendum)
Ms. Jaime Mccarthy,   It was a pleasure to speak with you today! As we discussed:?   We have applied for the [employer] for your cholesterol medication(s). This will cover the copay for Repatha if it is covered in-part by your pharmacy.   Your HealthWell ID is [id number] Password: [email or web address]  I will contact you once the medication is approved.  We will plan to schedule an in-person injection training visit here at [facility]. You will bring your medication with you to the appointment.    HealthWell Phone: [phone]  Monday through Friday 9am-5pm Website: [email or web address]   Please reach out prior to your next scheduled appointment should you have any questions or concerns.  Thank you!   Future Appointments  Date Time Provider Department Center  10/23/2023  8:15 AM [facility] ANNUAL WELLNESS VISIT 1 [facility]  12/04/2023 10:45 AM Glean Salvo, NP [facility] None  09/23/2024  9:00 AM [facility] LAB [facility]  09/30/2024 11:00 AM Tower, Audrie Gallus, MD [facility]    Loree Fee, PharmD Clinical Pharmacist [facility] [phone]

## 2023-10-16 ENCOUNTER — Telehealth: Payer: Self-pay | Admitting: Pharmacy Technician

## 2023-10-16 ENCOUNTER — Other Ambulatory Visit (HOSPITAL_COMMUNITY): Payer: Self-pay

## 2023-10-16 NOTE — Telephone Encounter (Signed)
Pharmacy Patient Advocate Encounter  Received notification from [facility] that Prior Authorization for repatha has been APPROVED from 12/02/22 to 12/01/24. Ran test claim, Copay is $141.00- 3 months. This test claim was processed through [facility]- copay amounts may vary at other pharmacies due to pharmacy/plan contracts, or as the patient moves through the different stages of their insurance plan.   PA #/Case ID/Reference #: [id number]

## 2023-10-16 NOTE — Telephone Encounter (Signed)
FYI to [place]

## 2023-10-16 NOTE — Telephone Encounter (Signed)
Pharmacy Patient Advocate Encounter   Received notification from CoverMyMeds that prior authorization for repatha is required/requested.   Insurance verification completed.   The patient is insured through [employer] .   Per test claim: PA required; PA submitted to above mentioned insurance via CoverMyMeds Key/confirmation #/EOC [id number] Status is pending

## 2023-10-23 ENCOUNTER — Ambulatory Visit: Payer: Medicare PPO

## 2023-10-23 ENCOUNTER — Telehealth: Payer: Self-pay | Admitting: *Deleted

## 2023-10-23 ENCOUNTER — Encounter: Payer: Self-pay | Admitting: Pharmacist

## 2023-10-23 VITALS — Ht 64.0 in | Wt 155.0 lb

## 2023-10-23 DIAGNOSIS — Z0000 Encounter for general adult medical examination without abnormal findings: Secondary | ICD-10-CM

## 2023-10-23 NOTE — Progress Notes (Signed)
Subjective:   Jaime Mccarthy is a 82 y.o. female who presents for Medicare Annual (Subsequent) preventive examination.  Visit Complete: Virtual I connected with  Jaime Mccarthy on 10/23/23 by a audio enabled telemedicine application and verified that I am speaking with the correct person using two identifiers.  Patient Location: Home  Provider Location: Office/Clinic  I discussed the limitations of evaluation and management by telemedicine. The patient expressed understanding and agreed to proceed.  Vital Signs: Because this visit was a virtual/telehealth visit, some criteria may be missing or patient reported. Any vitals not documented were not able to be obtained and vitals that have been documented are patient reported.  Patient Medicare AWV questionnaire was completed by the patient on (not done); I have confirmed that all information answered by patient is correct and no changes since this date. Cardiac Risk Factors include: advanced age (>81men, >65 women);dyslipidemia;hypertension;sedentary lifestyle    Objective:    Today's Vitals   10/23/23 0818  Weight: 155 lb (70.3 kg)  Height: 5\' 4"  (1.626 m)   Body mass index is 26.61 kg/m.     10/23/2023    8:32 AM 10/22/2022    8:34 AM 03/20/2017   10:11 AM 12/05/2015    9:59 AM 10/25/2014   10:00 AM  Advanced Directives  Does Patient Have a Medical Advance Directive? Yes Yes Yes Yes No  Type of Estate agent of [place];Living will Healthcare Power of [place];Living will Healthcare Power of [place];Living will Healthcare Power of [place];Living will   Does patient want to make changes to medical advance directive?  No - Patient declined     Copy of Healthcare Power of Attorney in Chart? Yes - validated most recent copy scanned in chart (See row information) Yes - validated most recent copy scanned in chart (See row information) No - copy requested      Current Medications (verified) Outpatient Encounter  Medications as of 10/23/2023  Medication Sig   acetaminophen (TYLENOL) 500 MG tablet Take 500-1,000 mg by mouth daily as needed. Do not exceed 3,000 mg per day (6 tablets).   bismuth subsalicylate (PEPTO BISMOL) 262 MG/15ML suspension Take 30 mLs by mouth daily as needed.   cholecalciferol (VITAMIN D) 1000 units tablet Take 2,000 Units by mouth daily.   [employer] FIBER GUMMIES PO Take 4 tablets by mouth daily.   diphenhydrAMINE HCl ([employer]S ALLERGY PO) Take 1 tablet by mouth daily as needed.   Evolocumab (REPATHA SURECLICK) 140 MG/ML SOAJ Inject 140 mg into the skin every 14 (fourteen) days.   hydrochlorothiazide (HYDRODIURIL) 25 MG tablet Take 1 tablet (25 mg total) by mouth daily.   metoprolol succinate (TOPROL-XL) 25 MG 24 hr tablet Take 1 tablet (25 mg total) by mouth daily.   MULTIPLE VITAMIN PO Take by mouth daily.   triamcinolone cream (KENALOG) 0.5 % Apply 1 Application topically 2 (two) times daily.   No facility-administered encounter medications on file as of 10/23/2023.    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   Past Surgical History:  Procedure Laterality Date   BREAST SURGERY     left breast needle biopsy negative   GALLBLADDER SURGERY  Family History  Problem Relation Age of Onset   COPD Mother    Cancer Mother        breast Ca   Heart disease Father        CAD   Cancer Father        lung CA   Depression Daughter    Heart disease Maternal Grandmother        heart failure   Social History   Socioeconomic History   Marital status: Married    Spouse name: Not on file   Number of children: 1   Years of education: 14   Highest education level: Not on file  Occupational History     Employer: [employer]  Tobacco Use   Smoking status: Former   Smokeless tobacco: Never  Advertising account planner   Vaping status: Never Used  Substance and Sexual Activity   Alcohol use: No    Comment: occasional   Drug use: No   Sexual activity: Not on file  Other Topics Concern   Not on file  Social History Narrative   Patient drinks about 2 cups of caffeine daily.   Patient is right handed.    Social Determinants of Health   Financial Resource Strain: Low Risk  (10/23/2023)   Overall Financial Resource Strain (CARDIA)    Difficulty of Paying Living Expenses: Not hard at all  Food Insecurity: No Food Insecurity (10/23/2023)   Hunger Vital Sign    Worried About Running Out of Food in the Last Year: Never true    Ran Out of Food in the Last Year: Never true  Transportation Needs: No Transportation Needs (10/23/2023)   PRAPARE - Administrator, Civil Service (Medical): No    Lack of Transportation (Non-Medical): No  Physical Activity: Inactive (10/23/2023)   Exercise Vital Sign    Days of Exercise per Week: 0 days    Minutes of Exercise per Session: 0 min  Stress: No Stress Concern Present (10/23/2023)   [employer] of Occupational Health - Occupational Stress Questionnaire    Feeling of Stress : Not at all  Social Connections: Moderately Isolated (10/23/2023)   Social Connection and Isolation Panel [NHANES]    Frequency of Communication with Friends and Family: Once a week    Frequency of Social Gatherings with Friends and Family: Twice a week    Attends Religious Services: Never    Database administrator or Organizations: No    Attends Engineer, structural: Never    Marital Status: Married    Tobacco Counseling Counseling given: Not Answered   Clinical Intake:  Pre-visit preparation completed: Yes  Pain : No/denies pain     BMI - recorded: 26.61 Nutritional Status: BMI 25 -29 Overweight Nutritional Risks: None Diabetes: No  How often  do you need to have someone help you when you read instructions, pamphlets, or other written materials from your doctor or pharmacy?: 1 - Never  Interpreter Needed?: No  Comments: lives with husband Information entered by :: Jaime Taliaferro,LPN   Activities of Daily Living    10/23/2023    8:33 AM  In your present state of health, do you have any difficulty performing the following activities:  Hearing? 0  Vision? 0  Difficulty concentrating or making decisions? 0  Walking or climbing stairs? 1  Dressing or bathing? 1  Doing errands, shopping? 1  Preparing Food and eating ? Y  Using the Toilet? Y  In the past six months, have you  accidently leaked urine? Y  Do you have problems with loss of bowel control? Y  Managing your Medications? Y  Managing your Finances? Y  Housekeeping or managing your Housekeeping? Y    Patient Care Team: Tower, Audrie Gallus, MD as PCP - [facility], [place] as Consulting Physician (Optometry)  Indicate any recent Medical Services you may have received from other than [facility] providers in the past year (date may be approximate).     Assessment:   This is a routine wellness examination for [place].  Hearing/Vision screen Hearing Screening - Comments:: Pt says her hearing is excellent Vision Screening - Comments:: Pt says her vision is good;glasses for reading and TV [place];Dr Bulakowski   Goals Addressed               This Visit's Progress     Follow up with Primary Care Provider   On track     Starting 03/24/2018, I will continue to take medications as prescribed and to keep appointments with PCP as scheduled.       health management   On track     Starting 03/20/17, I will continue to take medications as prescribed and to maintain a positive outlook on life.       Stay healthy (pt-stated)   On track      Depression Screen    10/23/2023    8:29 AM 10/22/2022    8:29 AM 09/19/2021    9:52 AM 09/15/2020    9:29 AM 09/08/2019     9:20 AM 03/24/2018   10:10 AM 03/20/2017   10:10 AM  PHQ 2/9 Scores  PHQ - 2 Score 1 0 0 0 0 0 0  PHQ- 9 Score      0     Fall Risk    10/23/2023    8:22 AM 08/29/2023   10:50 AM 10/22/2022    8:33 AM 09/19/2021    9:52 AM 09/15/2020    9:29 AM  Fall Risk   Falls in the past year? 1 1 1 1 1   Number falls in past yr: 1 1 0 1 1  Injury with Fall? 0 0 0 1 0  Risk for fall due to : Impaired balance/gait;Impaired mobility;History of fall(s) Other (Comment) No Fall Risks    Follow up Education provided;Falls prevention discussed Falls evaluation completed Falls prevention discussed Falls evaluation completed Falls evaluation completed    MEDICARE RISK AT HOME: Medicare Risk at Home Any stairs in or around the home?: No If so, are there any without handrails?: No Home free of loose throw rugs in walkways, pet beds, electrical cords, etc?: Yes Adequate lighting in your home to reduce risk of falls?: Yes Life alert?: No Use of a cane, walker or w/c?: Yes Grab bars in the bathroom?: Yes Shower chair or bench in shower?: Yes Elevated toilet seat or a handicapped toilet?: Yes  TIMED UP AND GO:  Was the test performed?  No    Cognitive Function:    03/24/2018   10:10 AM 03/20/2017   10:00 AM  MMSE - Mini Mental State Exam  Orientation to time 5 5  Orientation to Place 5 5  Registration 3 3  Attention/ Calculation 0 0  Recall 3 3  Language- name 2 objects 0 0  Language- repeat 1 1  Language- follow 3 step command 3 3  Language- read & follow direction 0 0  Write a sentence 0 0  Copy design 0 0  Total score  20 20        10/23/2023    8:36 AM 10/22/2022    8:34 AM  6CIT Screen  What Year? 0 points 0 points  What month? 0 points 0 points  What time? 0 points 0 points  Count back from 20  0 points  Months in reverse 4 points 0 points  Repeat phrase 0 points 0 points  Total Score  0 points    Immunizations Immunization History  Administered Date(s) Administered    Fluad Quad(high Dose 65+) 09/08/2019, 09/15/2020, 09/19/2021, 09/20/2022   Fluad Trivalent(High Dose 65+) 09/30/2023   Influenza Split 12/09/2012   Influenza,inj,Quad PF,6+ Mos 11/26/2013   Influenza-Unspecified 09/28/2015   PFIZER(Purple Top)SARS-COV-2 Vaccination 12/23/2019, 01/13/2020, 12/05/2020   Pneumococcal Conjugate-13 03/13/2015   Pneumococcal Polysaccharide-23 11/18/2012   Td 03/21/2005, 03/13/2015   Zoster, Live 09/05/2014    TDAP status: Up to date  Flu Vaccine status: Up to date  Pneumococcal vaccine status: Up to date  Covid-19 vaccine status: Completed vaccines  Qualifies for Shingles Vaccine? Yes   Zostavax completed No   Shingrix Completed?: No.    Education has been provided regarding the importance of this vaccine. Patient has been advised to call insurance company to determine out of pocket expense if they have not yet received this vaccine. Advised may also receive vaccine at local pharmacy or Health Dept. Verbalized acceptance and understanding. PT SAYS SHE WILL GET FROM PHARMACY SOON  Screening Tests Health Maintenance  Topic Date Due   Zoster Vaccines- Shingrix (1 of 2) 12/31/2023 (Originally 11/06/1993)   MAMMOGRAM  09/29/2024 (Originally 01/23/2023)   COVID-19 Vaccine (4 - 2023-24 season) 10/15/2024 (Originally 08/03/2023)   Medicare Annual Wellness (AWV)  10/22/2024   DTaP/Tdap/Td (3 - Tdap) 03/12/2025   Pneumonia Vaccine 41+ Years old  Completed   INFLUENZA VACCINE  Completed   DEXA SCAN  Completed   Hepatitis C Screening  Completed   HPV VACCINES  Aged Out   Colonoscopy  Discontinued    Health Maintenance  There are no preventive care reminders to display for this patient.   Colorectal cancer screening: No longer required.   Mammogram status: No longer required due to age.  Bone Density status: Completed 12/18/22. Results reflect: Bone density results: OSTEOPENIA. Repeat every 3-5 years.  Lung Cancer Screening: (Low Dose CT Chest recommended  if Age 104-80 years, 20 pack-year currently smoking OR have quit w/in 15years.) does not qualify.   Lung Cancer Screening Referral: no  Additional Screening:  Hepatitis C Screening: does not qualify; Completed 09/13/2022  Vision Screening: Recommended annual ophthalmology exams for early detection of glaucoma and other disorders of the eye. Is the patient up to date with their annual eye exam?  Yes  Who is the provider or what is the name of the office in which the patient attends annual eye exams? Dr Dion Body If pt is not established with a provider, would they like to be referred to a provider to establish care? No .   Dental Screening: Recommended annual dental exams for proper oral hygiene  Diabetic Foot Exam: n/a  Community Resource Referral / Chronic Care Management: CRR required this visit?  No   CCM required this visit?  No    Plan:     I have personally reviewed and noted the following in the patient's chart:   Medical and social history Use of alcohol, tobacco or illicit drugs  Current medications and supplements including opioid prescriptions. Patient is not currently taking opioid prescriptions. Functional  ability and status Nutritional status Physical activity Advanced directives List of other physicians Hospitalizations, surgeries, and ER visits in previous 12 months Vitals Screenings to include cognitive, depression, and falls Referrals and appointments  In addition, I have reviewed and discussed with patient certain preventive protocols, quality metrics, and best practice recommendations. A written personalized care plan for preventive services as well as general preventive health recommendations were provided to patient.    Sue Lush, LPN   16/09/9603   After Visit Summary: (MyChart) Due to this being a telephonic visit, the after visit summary with patients personalized plan was offered to patient via MyChart   Nurse Notes: Pt says she has  been expecting a call for visit with pharmacist regarding how to use Repatha. She indicates she has not taken the injection yet and will do so when gets how to instructions (face to face). Pt has no other concerns or questions at this time.

## 2023-10-23 NOTE — Patient Instructions (Signed)
Jaime Mccarthy , Thank you for taking time to come for your Medicare Wellness Visit. I appreciate your ongoing commitment to your health goals. Please review the following plan we discussed and let me know if I can assist you in the future.   Referrals/Orders/Follow-Ups/Clinician Recommendations: none  This is a list of the screening recommended for you and due dates:  Health Maintenance  Topic Date Due   Zoster (Shingles) Vaccine (1 of 2) 12/31/2023*   Mammogram  09/29/2024*   COVID-19 Vaccine (4 - 2023-24 season) 10/15/2024*   Medicare Annual Wellness Visit  10/22/2024   DTaP/Tdap/Td vaccine (3 - Tdap) 03/12/2025   Pneumonia Vaccine  Completed   Flu Shot  Completed   DEXA scan (bone density measurement)  Completed   Hepatitis C Screening  Completed   HPV Vaccine  Aged Out   Colon Cancer Screening  Discontinued  *Topic was postponed. The date shown is not the original due date.    Advanced directives: (In Chart) A copy of your advanced directives are scanned into your chart should your provider ever need it.  Next Medicare Annual Wellness Visit scheduled for next year: Yes11/24/25 @ 8:10am telephone

## 2023-10-23 NOTE — Telephone Encounter (Signed)
See prev comments. Jaime Mccarthy is okay doing teaching of Repatha next week but we have to have the PCP approval documented in chart to schedule the injection teaching

## 2023-10-23 NOTE — Telephone Encounter (Signed)
-----   Message from Loree Fee sent at 10/23/2023  3:48 PM EST ----- Could this patient be scheduled next week for an injection training nurse visit?  I have openings the following week, though patient would prefer to get started next week.  Thank you! ----- Message ----- From: Judy Pimple, MD Sent: 10/23/2023  11:24 AM EST To: Loree Fee, RPH  This was included in her amw (unsure if it was routed to you)    Nurse Notes: Pt says she has been expecting a call for visit with pharmacist regarding how to use Repatha. She indicates she has not taken the injection yet and will do so when gets how to instructions (face to face). Pt has no other concerns or questions at this time.   Just a heads up  Thanks so much

## 2023-10-23 NOTE — Telephone Encounter (Signed)
Yes, please do the appointment for injection training  Thanks

## 2023-10-24 NOTE — Telephone Encounter (Signed)
Called patient they state they did not know that next week was thanksgiving. States they received message that they have appointment December 4,2024. They are fine with that just need time. Advised we will send message to lindsay and she will reach out.

## 2023-11-05 ENCOUNTER — Ambulatory Visit: Payer: Medicare PPO | Admitting: Pharmacist

## 2023-11-05 DIAGNOSIS — E7800 Pure hypercholesterolemia, unspecified: Secondary | ICD-10-CM | POA: Diagnosis not present

## 2023-11-05 NOTE — Addendum Note (Signed)
Addended by: Loree Fee on: 11/05/2023 04:27 PM   Modules accepted: Orders

## 2023-11-05 NOTE — Patient Instructions (Signed)
Ms. TAYGAN WHITTIKER,   It was a pleasure to speak with you today! As we discussed:?   Continue Repatha. You will give an injection ever 2 weeks (every other Wednesday).   I have called [facility] to make sure they save your HealthWell card for all Repatha refills.   I also spoke with [facility] Finance department who states they will reimburse you for the $131 copay that you already paid for your first Repatha fill.    Please reach out prior to your next scheduled appointment should you have any questions or concerns.   We have scheduled a lab-only visit for February, see below.   Thank you!   Future Appointments  Date Time Provider Department Center  01/12/2024  9:45 AM [facility] LAB [facility]  03/02/2024 10:45 AM Glean Salvo, NP [facility] None  09/23/2024  9:00 AM [facility] LAB [facility]  09/30/2024 11:00 AM Tower, Audrie Gallus, MD [facility]  10/25/2024  8:10 AM [facility] ANNUAL WELLNESS VISIT 1 [facility]    Loree Fee, PharmD Clinical Pharmacist [facility] [phone]

## 2023-11-05 NOTE — Progress Notes (Addendum)
   11/05/2023 Name: Jaime Mccarthy MRN: [id number] DOB: 12/01/1941  Subjective  Chief Complaint  Patient presents with   Hyperlipidemia   Reason for visit: Jaime Mccarthy is a 82 y.o. year old female who was referred for medication management by their primary care provider, Tower, Audrie Gallus, MD. They presented for a face to face visit today for injection training for Repatha.  Care Team: Primary Care Provider: Tower, Audrie Gallus, MD  Medication Access/Adherence: ?  Prescription drug coverage: Payor: HU[employer]NA MEDICARE / Plan: HUMANA MEDICARE CHOICE PPO / Product Type: *No Product type* / .   Last month, assisted patient with enrollment in [facility] for Hypercholesterolemia to cover the cost of Repatha copay. She reports the pharmacy did charge her for the first fill.   Patient presents today with her husband. They have brought Repatha medication to today's visit for injection training.   Assessment and Plan:   Injection Training (Repatha) Reviewed Repatha MOA, storage/administration. We also reviewed possible side effects and that incidences are low of muscle-related side effects.  Discussed LDL-reduction potential of PCSK9i Repeat lipid panel ordered for 4-12 weeks.  Lab visit scheduled for 9 weeks, 01/11/23 She verbalized understanding and all of Her questions were answered.?   Medication Assistance (Repatha): As previously documented, patient is enrolled actively in [employer]. Pharmacy did not bill the Healthwell card secondary to her insurance.  Called [facility] who states that they do not accept copay cards. Informed them this is a medication grant which is different than a coupon.  Pulled patient's HealthWell information through provider portal Pharmacy Card Card [id number] Card Status Active BIN [id number] PCN [id number] PCN Group [id number]  [facility] confirms they will reimburse patient $131 copay.  Future ref:  Billing dept phone: [phone] All future fills will be billed to [facility].   Future Appointments  Date Time Provider Department Center  01/12/2024  9:45 AM [facility] LAB [facility]  03/02/2024 10:45 AM Glean Salvo, NP [facility] None  09/23/2024  9:00 AM [facility] LAB [facility]  09/30/2024 11:00 AM Tower, Audrie Gallus, MD [facility]  10/25/2024  8:10 AM [facility] ANNUAL WELLNESS VISIT 1 [facility]    Loree Fee, PharmD Clinical Pharmacist [facility] [phone]

## 2023-12-04 ENCOUNTER — Ambulatory Visit: Payer: Medicare PPO | Admitting: Neurology

## 2024-01-05 ENCOUNTER — Telehealth: Payer: Self-pay | Admitting: *Deleted

## 2024-01-05 NOTE — Telephone Encounter (Signed)
Copied from CRM [id number]. Topic: Clinical - Prescription Issue >> Jan 05, 2024 12:45 PM Danika B wrote:   Reason for CRM: Patients husband Ron called in with wife stating that Rx Evolocumab (REPATHA SURECLICK) 140 MG/ML SOAJ renewal listed a $481 copay and that they have discussed a previous similar issue with [facility] [employer]. States that they were told by Mardella Layman to leave her a voicemail on her cell number to rea

## 2024-01-05 NOTE — Telephone Encounter (Deleted)
Copied from CRM [id number]. Topic: Clinical - Prescription Issue >> Jan 05, 2024 12:45 PM Danika B wrote: Reason for CRM: Patients husband Ron called in with wife stating that Rx Evolocumab (REPATHA SURECLICK) 140 MG/ML SOAJ renewal listed a $481 copay and that they have discussed a previous similar issue with [facility] [facility] [employer]. States that they were told by Mardella Layman to leave her a voicemail on her cell number to reach her. Per CAL cell number not provided and Mardella Layman out of office today. They insisted that she call them back at her earliest convenience tomorrow. Callback Ron's cell #[phone]. Requested she leave voicemail if no answer, leaving name and cell number.

## 2024-01-12 ENCOUNTER — Other Ambulatory Visit (INDEPENDENT_AMBULATORY_CARE_PROVIDER_SITE_OTHER): Payer: Medicare PPO

## 2024-01-12 ENCOUNTER — Encounter: Payer: Self-pay | Admitting: Family Medicine

## 2024-01-12 DIAGNOSIS — E7800 Pure hypercholesterolemia, unspecified: Secondary | ICD-10-CM

## 2024-01-12 LAB — LIPID PANEL
Cholesterol: 224 mg/dL — ABNORMAL HIGH (ref 0–200)
HDL: 64.6 mg/dL (ref >39.00)
LDL Cholesterol: 121 mg/dL — ABNORMAL HIGH (ref 0–99)
NonHDL: 158.92
Total CHOL/HDL Ratio: 3
Triglycerides: 192 mg/dL — ABNORMAL HIGH (ref 0.0–149.0)
VLDL: 38.4 mg/dL (ref 0.0–40.0)

## 2024-01-13 ENCOUNTER — Telehealth: Payer: Self-pay | Admitting: Neurology

## 2024-01-13 NOTE — Telephone Encounter (Signed)
Appointment details confirmed

## 2024-03-01 NOTE — Progress Notes (Unsigned)
 Patient: Jaime Mccarthy DOB: May 31, 1941  Reason for visit: Follow up spinocerebellar ataxia History from: patient, husband Primary Neurologist: Dr. Terrace Arabia   ASSESSMENT AND PLAN 83 y.o. 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 here with:  1.  Spinocerebellar ataxia 2.  Gait disturbance  -She has remained overall stable since seen 2 years ago.  She feels she is falling less.  We discussed the importance of safe ambulation with fall prevention.  I encouraged her to be as active as possible.  She is mostly in her wheelchair, but can utilize her walker for short distances.  She will follow-up in 1 year or sooner if needed.  HISTORY OF PRESENT ILLNESS: Today 03/02/24  Here with her husband. Missed her appointment last year due to her husband having surgery. Thinks she has fallen less, only 3 in the last year. Uses her walker to get to the bathroom, requires a lot of upper body strength to use the walker. Mostly in the wheelchair. She tries very hard to remain active and put forth a lot of effort. Needs assistance with showering. With feeding, doesn't like to eat in public due to shaking in hands. She is not in pain. She lives with her husband at home. Has lost about 15 lbs since 2022. She is sedentary, not much appetite, likes to watch TV/read. No health issues.   03/07/22 SS: Jaime Mccarthy here today for follow-up.  With her daughter.  She continues to do well.  She has a Midwife.  She is mostly using her wheelchair.  However does use her walker to the bathroom. Does her shower with assistance.  She has noted more shaking in her hands.  Hard to use a computer mouse.  She will have about 1 fall a month.  Fortunately, she has not been injured.  Recently she fell to her knees, she was able to get herself up.  Continues to have a slow progression.  No changes to her  overall health.  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  Continues to live with her husband.  Her condition continues to have a slow progression.  She still uses her walker in the home.  On average she may have 1-2 falls a month.  When she goes out she uses a wheelchair.  Does not feel she would be able to operate a scooter or an Mining engineer wheelchair.  When she stands, both her knees hyperextend, making them feel weak.  She is able to do her own ADLs, does need assistance in the shower.  The right knee is most bothersome, previously saw sports medicine doctor years ago, had injections to the knees, considering going back.  Presents today for evaluation accompanied by husband.  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  She has had a very slow gradual progression of her ability to ambulate.  She uses a walker, but is still prone to fall.  She continues to have more difficulty walking, she is falling about 2-3 times a month.  She says she would fall more if she did not catch herself.  She uses her walker at home, uses her wheelchair when she goes out.  She has noticed that when she is standing, her right knee feels weak, that her right  leg bows, she been wearing a knee sleeve.  She also reports some urinary and bowel incontinence when she stands.  She is able to use the bathroom, and do her bathing, as she has adapted her bathroom for safety.  She is considering getting an Mining engineer wheelchair or scooter.  She presents today for evaluation accompanied by her daughter.  HISTORY 12/10/2018 Dr. 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  The patient has had a very slow gradual progression of her ability to ambulate.  She is falling once or twice  a month, even with a walker.  She oftentimes will fall backwards landing on her buttocks but she fortunately has not sustained any significant injuries.  The patient has modified her bathroom and shower to increase safety.  She denies any issues with swallowing per se.  She uses a wheelchair when outside of the house.  She will continue to walk some with a walker inside the house.  Her speech is affected with the ataxia.  She returns for an evaluation.  REVIEW OF SYSTEMS: Out of a complete 14 system review of symptoms, the patient complains only of the following symptoms, and all other reviewed systems are negative.  See HPI  ALLERGIES: Allergies  Allergen Reactions   Statins     Ataxia and weakness/myopathy    Zetia [Ezetimibe]     Weakness     HOME MEDICATIONS: Outpatient Medications Prior to Visit  Medication Sig Dispense Refill   acetaminophen (TYLENOL) 500 MG tablet Take 500-1,000 mg by mouth daily as needed. Do not exceed 3,000 mg per day (6 tablets).     bismuth subsalicylate (PEPTO BISMOL) 262 MG/15ML suspension Take 30 mLs by mouth daily as needed.     cholecalciferol (VITAMIN D) 1000 units tablet Take 2,000 Units by mouth daily.     CVS FIBER GUMMIES PO Take 4 tablets by mouth daily.     diphenhydrAMINE HCl (CVS ALLERGY PO) Take 1 tablet by mouth daily as needed.     Evolocumab (REPATHA SURECLICK) 140 MG/ML SOAJ Inject 140 mg into the skin every 14 (fourteen) days. 6 mL 3   hydrochlorothiazide (HYDRODIURIL) 25 MG tablet Take 1 tablet (25 mg total) by mouth daily. 90 tablet 3   metoprolol succinate (TOPROL-XL) 25 MG 24 hr tablet Take 1 tablet (25 mg total) by mouth daily. 90 tablet 3   MULTIPLE VITAMIN PO Take by mouth daily.     triamcinolone cream (KENALOG) 0.5 % Apply 1 Application topically 2 (two) times daily. 30 g 0   No facility-administered medications prior to visit.    PAST MEDICAL HISTORY: Past Medical History:  Diagnosis Date   Allergy    allergic rhinitis  seasonal   Ataxia    degenerative cerebellar   Degenerative arthritis     Left knee   Dyslipidemia    Gait disorder    Hyperlipidemia    Hypertension    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    PAST SURGICAL HISTORY: Past Surgical History:  Procedure Laterality Date   BREAST SURGERY     left breast needle biopsy negative   GALLBLADDER SURGERY      FAMILY HISTORY: Family History  Problem Relation Age of Onset   COPD Mother    Cancer Mother        breast Ca   Heart disease Father        CAD   Cancer Father  lung CA   Depression Daughter    Heart disease Maternal Grandmother        heart failure    SOCIAL HISTORY: Social History   Socioeconomic History   Marital status: Married    Spouse name: Not on file   Number of children: 1   Years of education: 14   Highest education level: Not on file  Occupational History    Employer: [employer]  Tobacco Use   Smoking status: Former   Smokeless tobacco: Never  Advertising account planner   Vaping status: Never Used  Substance and Sexual Activity   Alcohol use: No    Comment: occasional   Drug use: No   Sexual activity: Not on file  Other Topics Concern   Not on file  Social History Narrative   Patient drinks about 2 cups of caffeine daily.   Patient is right handed.    Social Drivers of Corporate investment banker Strain: Low Risk  (10/23/2023)   Overall Financial Resource Strain (CARDIA)    Difficulty of Paying Living Expenses: Not hard at all  Food Insecurity: No Food Insecurity (10/23/2023)   Hunger Vital Sign    Worried About Running Out of Food in the Last Year: Never true    Ran Out of Food in the Last Year: Never true  Transportation Needs: No Transportation Needs (10/23/2023)   PRAPARE - Administrator, Civil Service (Medical): No    Lack of Transportation (Non-Medical): No  Physical Activity: Inactive (10/23/2023)   Exercise Vital Sign    Days of Exercise per Week: 0 days    Minutes of Exercise  per Session: 0 min  Stress: No Stress Concern Present (10/23/2023)   [employer] of Occupational Health - Occupational Stress Questionnaire    Feeling of Stress : Not at all  Social Connections: Moderately Isolated (10/23/2023)   Social Connection and Isolation Panel [NHANES]    Frequency of Communication with Friends and Family: Once a week    Frequency of Social Gatherings with Friends and Family: Twice a week    Attends Religious Services: Never    Database administrator or Organizations: No    Attends Banker Meetings: Never    Marital Status: Married  Catering manager Violence: Not At Risk (10/23/2023)   Humiliation, Afraid, Rape, and Kick questionnaire    Fear of Current or Ex-Partner: No    Emotionally Abused: No    Physically Abused: No    Sexually Abused: No   PHYSICAL EXAM  Vitals:   03/02/24 1042  BP: 126/74  Pulse: 78  Weight: 151 lb (68.5 kg)  Height: 5\' 3"  (1.6 m)   Body mass index is 26.75 kg/m.  Generalized: Well developed, in no acute distress, seated in wheelchair, head nod noted  Neurological examination  Mentation: Alert oriented to time, place, history taking. Follows all commands speech, ataxia with speech. Very pleasant Cranial nerve II-XII: Pupils were equal round reactive to light. End gaze nystagmus bilaterally. Facial sensation and strength were normal.  Head turning and shoulder shrug  were normal and symmetric. Motor: Strength is well-maintained throughout Sensory: Sensory testing is intact to soft touch on all 4 extremities. No evidence of extinction is noted.  Coordination: Moderate ataxia with finger-nose-finger and heel-to-shin bilaterally Gait and station: Able to stand with pushoff and assistance, she has a tendency to lean backwards, was unsteady, was not ambulated   DIAGNOSTIC DATA (LABS, IMAGING, TESTING) - I reviewed patient records, labs,  notes, testing and imaging myself where available.  Lab Results  Component  Value Date   WBC 7.8 09/19/2023   HGB 14.8 09/19/2023   HCT 45.9 09/19/2023   MCV 89.9 09/19/2023   PLT 314.0 09/19/2023      Component Value Date/Time   NA 141 09/19/2023 0913   K 4.0 09/19/2023 0913   CL 94 (L) 09/19/2023 0913   CO2 36 (H) 09/19/2023 0913   GLUCOSE 88 09/19/2023 0913   BUN 10 09/19/2023 0913   CREATININE 0.76 09/19/2023 0913   CALCIUM 10.9 (H) 09/19/2023 0913   PROT 7.1 09/19/2023 0913   ALBUMIN 4.4 09/19/2023 0913   AST 20 09/19/2023 0913   ALT 18 09/19/2023 0913   ALKPHOS 85 09/19/2023 0913   BILITOT 0.6 09/19/2023 0913   GFRNONAA 73.95 08/28/2010 0947   GFRAA 109 06/02/2007 0856   Lab Results  Component Value Date   CHOL 224 (H) 01/12/2024   HDL 64.60 01/12/2024   LDLCALC 121 (H) 01/12/2024   LDLDIRECT 186.5 11/18/2013   TRIG 192.0 (H) 01/12/2024   CHOLHDL 3 01/12/2024   No results found for: "HGBA1C" Lab Results  Component Value Date   VITAMINB12 348 03/02/2013   Lab Results  Component Value Date   TSH 3.14 09/19/2023   Margie Ege, AGNP-C, DNP 03/02/2024, 10:58 AM [facility] [address] [place], [place] [address] [phone]

## 2024-03-02 ENCOUNTER — Ambulatory Visit (INDEPENDENT_AMBULATORY_CARE_PROVIDER_SITE_OTHER): Payer: Medicare PPO | Admitting: Neurology

## 2024-03-02 ENCOUNTER — Encounter: Payer: Self-pay | Admitting: Neurology

## 2024-03-02 VITALS — BP 126/74 | HR 78 | Ht 63.0 in | Wt 151.0 lb

## 2024-03-02 DIAGNOSIS — G118 Other hereditary ataxias: Secondary | ICD-10-CM

## 2024-03-02 DIAGNOSIS — R269 Unspecified abnormalities of gait and mobility: Secondary | ICD-10-CM

## 2024-03-02 NOTE — Patient Instructions (Signed)
 So great to see you today!! Continue to practice safe ambulation, fall prevention. Try to be as active as possible Follow-up in 1 year.  Thanks!!

## 2024-07-16 ENCOUNTER — Other Ambulatory Visit: Payer: Self-pay | Admitting: Family Medicine

## 2024-07-16 NOTE — Telephone Encounter (Signed)
 Last filled on 08/29/23 #30 g/ 0 refills   CPE scheduled 09/30/24

## 2024-08-03 DIAGNOSIS — H5203 Hypermetropia, bilateral: Secondary | ICD-10-CM | POA: Diagnosis not present

## 2024-08-03 DIAGNOSIS — H0288B Meibomian gland dysfunction left eye, upper and lower eyelids: Secondary | ICD-10-CM | POA: Diagnosis not present

## 2024-08-03 DIAGNOSIS — H2513 Age-related nuclear cataract, bilateral: Secondary | ICD-10-CM | POA: Diagnosis not present

## 2024-08-03 DIAGNOSIS — H524 Presbyopia: Secondary | ICD-10-CM | POA: Diagnosis not present

## 2024-08-03 DIAGNOSIS — H0288A Meibomian gland dysfunction right eye, upper and lower eyelids: Secondary | ICD-10-CM | POA: Diagnosis not present

## 2024-08-03 DIAGNOSIS — H25013 Cortical age-related cataract, bilateral: Secondary | ICD-10-CM | POA: Diagnosis not present

## 2024-08-25 ENCOUNTER — Other Ambulatory Visit: Payer: Self-pay | Admitting: Family Medicine

## 2024-08-25 DIAGNOSIS — I10 Essential (primary) hypertension: Secondary | ICD-10-CM

## 2024-09-06 ENCOUNTER — Other Ambulatory Visit: Payer: Self-pay | Admitting: Family Medicine

## 2024-09-06 DIAGNOSIS — E7800 Pure hypercholesterolemia, unspecified: Secondary | ICD-10-CM

## 2024-09-07 NOTE — Telephone Encounter (Signed)
 CPE scheduled 09/30/24  Last filled on 10/09/23 # 6 mL/ 3 refills

## 2024-09-14 ENCOUNTER — Encounter: Payer: Self-pay | Admitting: Pharmacist

## 2024-09-14 NOTE — Progress Notes (Signed)
 Brief Telephone Documentation Reason for Call: Patient'shusband left message regarding question for pharmacist about Repatha   Summary of Call: Message states Repatha  has a copay of ~$150 through [facility] mail order pharmacy. Used to get for $0 with healthwell  Considerations Per Healthwell portal, enrollment expired on 09/07/24. Re-enrolled patient successfully for another year of coverage Called [facility] pharmacy and provided updated grant information.  Asked [facility] to rebill the recent claim. They report they will do this automatically and reimburse the patient   Called and updated patient's spouse with the above information.   [employer] - Re-enrollment   Medication(s): All cholesterol medications (Repatha )   Currently Enrolled: Through 09/07/24 - Expired    Application Status:  Approved for re-enrollment    HealthWell ID: [id number] Fund: Hypercholesterolemia - Medicare Access Assistance Type: Co-pay Start Date: 09/08/2024 End Date: 09/07/2025               Rx Card: Card No.  [id number] RX BIN:  [id number] PCN:  [id number] Group:  [id number]     Manuelita FABIENE Kobs, PharmD Clinical Pharmacist [facility] [phone]

## 2024-09-21 ENCOUNTER — Telehealth: Payer: Self-pay | Admitting: Family Medicine

## 2024-09-21 DIAGNOSIS — E7800 Pure hypercholesterolemia, unspecified: Secondary | ICD-10-CM

## 2024-09-21 DIAGNOSIS — I10 Essential (primary) hypertension: Secondary | ICD-10-CM

## 2024-09-21 DIAGNOSIS — E559 Vitamin D deficiency, unspecified: Secondary | ICD-10-CM

## 2024-09-21 NOTE — Telephone Encounter (Signed)
-----   Message from Veva JINNY Ferrari sent at 09/06/2024  2:49 PM EDT ----- Regarding: Lab orders for [facility], 10.23.25 Patient is scheduled for CPX labs, please order future labs, Thanks , Veva

## 2024-09-23 ENCOUNTER — Other Ambulatory Visit: Payer: Medicare PPO

## 2024-09-23 ENCOUNTER — Ambulatory Visit: Payer: Self-pay | Admitting: Family Medicine

## 2024-09-23 DIAGNOSIS — I10 Essential (primary) hypertension: Secondary | ICD-10-CM | POA: Diagnosis not present

## 2024-09-23 DIAGNOSIS — E7800 Pure hypercholesterolemia, unspecified: Secondary | ICD-10-CM

## 2024-09-23 DIAGNOSIS — E559 Vitamin D deficiency, unspecified: Secondary | ICD-10-CM | POA: Diagnosis not present

## 2024-09-23 LAB — COMPREHENSIVE METABOLIC PANEL WITH GFR
ALT: 14 U/L (ref 0–35)
AST: 16 U/L (ref 0–37)
Albumin: 4.4 g/dL (ref 3.5–5.2)
Alkaline Phosphatase: 72 U/L (ref 39–117)
BUN: 11 mg/dL (ref 6–23)
CO2: 33 meq/L — ABNORMAL HIGH (ref 19–32)
Calcium: 10.2 mg/dL (ref 8.4–10.5)
Chloride: 96 meq/L (ref 96–112)
Creatinine, Ser: 0.79 mg/dL (ref 0.40–1.20)
GFR: 70.42 mL/min (ref >60.00)
Glucose, Bld: 87 mg/dL (ref 70–99)
Potassium: 3.9 meq/L (ref 3.5–5.1)
Sodium: 139 meq/L (ref 135–145)
Total Bilirubin: 0.6 mg/dL (ref 0.2–1.2)
Total Protein: 6.9 g/dL (ref 6.0–8.3)

## 2024-09-23 LAB — CBC WITH DIFFERENTIAL/PLATELET
Basophils Absolute: 0 K/uL (ref 0.0–0.1)
Basophils Relative: 0.3 % (ref 0.0–3.0)
Eosinophils Absolute: 0.1 K/uL (ref 0.0–0.7)
Eosinophils Relative: 1.6 % (ref 0.0–5.0)
HCT: 41.6 % (ref 36.0–46.0)
Hemoglobin: 13.9 g/dL (ref 12.0–15.0)
Lymphocytes Relative: 25.4 % (ref 12.0–46.0)
Lymphs Abs: 2.1 K/uL (ref 0.7–4.0)
MCHC: 33.3 g/dL (ref 30.0–36.0)
MCV: 88.9 fl (ref 78.0–100.0)
Monocytes Absolute: 0.7 K/uL (ref 0.1–1.0)
Monocytes Relative: 8.7 % (ref 3.0–12.0)
Neutro Abs: 5.2 K/uL (ref 1.4–7.7)
Neutrophils Relative %: 64 % (ref 43.0–77.0)
Platelets: 331 K/uL (ref 150.0–400.0)
RBC: 4.68 Mil/uL (ref 3.87–5.11)
RDW: 13.9 % (ref 11.5–15.5)
WBC: 8.2 K/uL (ref 4.0–10.5)

## 2024-09-23 LAB — TSH: TSH: 3.56 u[IU]/mL (ref 0.35–5.50)

## 2024-09-23 LAB — VITAMIN D 25 HYDROXY (VIT D DEFICIENCY, FRACTURES): VITD: 62.07 ng/mL (ref 30.00–100.00)

## 2024-09-23 LAB — LIPID PANEL
Cholesterol: 160 mg/dL (ref 0–200)
HDL: 67.9 mg/dL (ref >39.00)
LDL Cholesterol: 54 mg/dL (ref 0–99)
NonHDL: 92.5
Total CHOL/HDL Ratio: 2
Triglycerides: 195 mg/dL — ABNORMAL HIGH (ref 0.0–149.0)
VLDL: 39 mg/dL (ref 0.0–40.0)

## 2024-09-30 ENCOUNTER — Ambulatory Visit (INDEPENDENT_AMBULATORY_CARE_PROVIDER_SITE_OTHER): Payer: Medicare PPO | Admitting: Family Medicine

## 2024-09-30 ENCOUNTER — Encounter: Payer: Self-pay | Admitting: Family Medicine

## 2024-09-30 VITALS — BP 136/72 | HR 63 | Temp 98.2°F

## 2024-09-30 DIAGNOSIS — M85859 Other specified disorders of bone density and structure, unspecified thigh: Secondary | ICD-10-CM

## 2024-09-30 DIAGNOSIS — Z0000 Encounter for general adult medical examination without abnormal findings: Secondary | ICD-10-CM

## 2024-09-30 DIAGNOSIS — H6123 Impacted cerumen, bilateral: Secondary | ICD-10-CM

## 2024-09-30 DIAGNOSIS — R7989 Other specified abnormal findings of blood chemistry: Secondary | ICD-10-CM | POA: Diagnosis not present

## 2024-09-30 DIAGNOSIS — Z23 Encounter for immunization: Secondary | ICD-10-CM | POA: Diagnosis not present

## 2024-09-30 DIAGNOSIS — E559 Vitamin D deficiency, unspecified: Secondary | ICD-10-CM

## 2024-09-30 DIAGNOSIS — I10 Essential (primary) hypertension: Secondary | ICD-10-CM

## 2024-09-30 DIAGNOSIS — Z1211 Encounter for screening for malignant neoplasm of colon: Secondary | ICD-10-CM

## 2024-09-30 DIAGNOSIS — E7800 Pure hypercholesterolemia, unspecified: Secondary | ICD-10-CM

## 2024-09-30 DIAGNOSIS — G118 Other hereditary ataxias: Secondary | ICD-10-CM | POA: Diagnosis not present

## 2024-09-30 NOTE — Patient Instructions (Addendum)
 If you have any questions/issues for Repatha - Call or message Manuelita  For high triglycerides Watch both fat and sugar in diet  Avoid added sugars in your diet when you can  Try to get most of your carbohydrates from produce (with the exception of white potatoes) and whole grains Eat less bread/pasta/rice/snack foods/cereals/sweets and other items from the middle of the grocery store (processed carbs)   Avoid red meat/ fried foods/ egg yolks/ fatty breakfast meats/ butter, cheese and high fat dairy/ and shellfish   Please consider some chair exercise  Add some strength training to your routine, this is important for bone and brain health and can reduce your risk of falls and help your body use insulin properly and regulate weight  Light weights, exercise bands , and internet videos are a good way to start  Yoga (chair or regular), machines , floor exercises or a gym with machines are also good options   Keep walking with your walker   Get debrox solution over the counter (or something similar) Use as directed for ear wax as least twice weekly for a month Follow up in about a month and we will try and flush your ears

## 2024-09-30 NOTE — Assessment & Plan Note (Signed)
 bp in fair control at this time  BP Readings from Last 1 Encounters:  09/30/24 136/72   No changes needed Most recent labs reviewed  Disc lifstyle change with low sodium diet and exercise  Plan to continue hctz 25 mg daily  Metoprolol  xl 25 mg daily   Encouraged more exercise (seated) as tolerated

## 2024-09-30 NOTE — Assessment & Plan Note (Signed)
 Disc goals for lipids and reasons to control them Rev last labs with pt Rev low sat fat diet in detail Much improved with repatha   Pt continues working with pharm serv for pt assist Intol of zetia   Myopathy with statin (neurologically contraindicated)   LDL is down to 54  Repatha  140 mg every 14 days  Good HDL  Trig still high-discussed reducing dietary sugar and fat

## 2024-09-30 NOTE — Assessment & Plan Note (Signed)
Declines further colon cancer screening  

## 2024-09-30 NOTE — Assessment & Plan Note (Signed)
 Fairly stable Utd neuro care/reviewed note  Wheelchair much of the time Uses walker to get to the bathroom (frequently)   Less falls No injuries

## 2024-09-30 NOTE — Assessment & Plan Note (Signed)
 Dexa 12/2022 Some falls (ataxia) , no fractures D level is normal  Encouraged more seated strength building exercise   Discussed fall prevention, supplements and exercise for bone density

## 2024-09-30 NOTE — Assessment & Plan Note (Signed)
 Bilateral  With some hearing loss Instructed to use debrox at least 2 times weekly  Follow up for ear irrigation in about a month

## 2024-09-30 NOTE — Assessment & Plan Note (Signed)
 Last vitamin D  Lab Results  Component Value Date   VD25OH 62.07 09/23/2024   Encouraged to continue ca and D for bone and overall health

## 2024-09-30 NOTE — Assessment & Plan Note (Signed)
 Reviewed health habits including diet and exercise and skin cancer prevention Reviewed appropriate screening tests for age  Also reviewed health mt list, fam hx and immunization status , as well as social and family history   See HPI Labs reviewed and ordered Plans to get shingrix vaccine soon  Flu shot given  Declines breast and colon cancer screening  Dexa utd 12/2022 Discussed fall prevention, supplements and exercise for bone density  PHQ last was 0

## 2024-09-30 NOTE — Assessment & Plan Note (Signed)
 TSH normal this check Lab Results  Component Value Date   TSH 3.56 09/23/2024

## 2024-09-30 NOTE — Progress Notes (Signed)
 Subjective:    Patient ID: Jaime Mccarthy, female    DOB: February 01, 1941, 83 y.o.   MRN: [id number]  HPI  Here for health maintenance exam and to review chronic medical problems   Wt Readings from Last 3 Encounters:  03/02/24 151 lb (68.5 kg)  10/23/23 155 lb (70.3 kg)  08/29/23 155 lb 12.8 oz (70.7 kg)    No weight today-cannot get out of wheelchair   Vitals:   09/30/24 1052 09/30/24 1118  BP: (!) 148/74 136/72  Pulse: 63   Temp: 98.2 F (36.8 C)   SpO2: 98%     Immunization History  Administered Date(s) Administered   Fluad Quad(high Dose 65+) 09/08/2019, 09/15/2020, 09/19/2021, 09/20/2022   Fluad Trivalent(High Dose 65+) 09/30/2023   INFLUENZA, HIGH DOSE SEASONAL PF 09/30/2024   Influenza Split 12/09/2012   Influenza,inj,Quad PF,6+ Mos 11/26/2013   Influenza-Unspecified 09/28/2015   PFIZER(Purple Top)SARS-COV-2 Vaccination 12/23/2019, 01/13/2020, 12/05/2020   Pneumococcal Conjugate-13 03/13/2015   Pneumococcal Polysaccharide-23 11/18/2012   Td 03/21/2005, 03/13/2015   Zoster Recombinant(Shingrix) 07/16/2024   Zoster, Live 09/05/2014    Health Maintenance Due  Topic Date Due   Medicare Annual Wellness (AWV)  10/22/2024   Feeling good overall   Mole on left inner arm   Flu shot-today  Shingrix -had one/ went to get next one and there was a documentation problem   Mammogram- declines  Self breast exam-no lumps   Gyn health  Colon cancer screening -declines due to mobility   Bone health  Dexa  12/2022  Falls-1-2 (better than the past)- using walker and wheelchair (ataxia)  Fractures-none  Supplements  Last vitamin D  Lab Results  Component Value Date   VD25OH 62.07 09/23/2024    Exercise  None  Wheelchair bound  [employer] bathroom back and forth     Mood    09/30/2024   10:52 AM 10/23/2023    8:29 AM 10/22/2022    8:29 AM 09/19/2021    9:52 AM 09/15/2020    9:29 AM  Depression screen PHQ 2/9  Decreased Interest 0 0 0 0 0  Down,  Depressed, Hopeless 0 1 0 0 0  PHQ - 2 Score 0 1 0 0 0  Altered sleeping 0      Tired, decreased energy 0      Change in appetite 0      Feeling bad or failure about yourself  0      Trouble concentrating 0      Moving slowly or fidgety/restless 0      Suicidal thoughts 0      PHQ-9 Score 0      Difficult doing work/chores Not difficult at all       HTN bp is stable today  No cp or palpitations or headaches or edema  No side effects to medicines  BP Readings from Last 3 Encounters:  09/30/24 136/72  03/02/24 126/74  09/30/23 130/72     Lab Results  Component Value Date   NA 139 09/23/2024   K 3.9 09/23/2024   CO2 33 (H) 09/23/2024   GLUCOSE 87 09/23/2024   BUN 11 09/23/2024   CREATININE 0.79 09/23/2024   CALCIUM 10.2 09/23/2024   GFR 70.42 09/23/2024   GFRNONAA 73.95 08/28/2010   Hydrochlorothiazide  25 mg daily  Metoprolol  xl 25 mg daily    Spinocerebellar ataxia  Progressive Neuro care  Mobility impaired   Lab Results  Component Value Date   TSH 3.56 09/23/2024    Hyperlipidemia  Lab  Results  Component Value Date   CHOL 160 09/23/2024   CHOL 224 (H) 01/12/2024   CHOL 289 (H) 09/19/2023   Lab Results  Component Value Date   HDL 67.90 09/23/2024   HDL 64.60 01/12/2024   HDL 60.80 09/19/2023   Lab Results  Component Value Date   LDLCALC 54 09/23/2024   LDLCALC 121 (H) 01/12/2024   LDLCALC 189 (H) 09/19/2023   Lab Results  Component Value Date   TRIG 195.0 (H) 09/23/2024   TRIG 192.0 (H) 01/12/2024   TRIG 196.0 (H) 09/19/2023   Lab Results  Component Value Date   CHOLHDL 2 09/23/2024   CHOLHDL 3 01/12/2024   CHOLHDL 5 09/19/2023   Lab Results  Component Value Date   LDLDIRECT 186.5 11/18/2013   LDLDIRECT 147.0 09/25/2012   LDLDIRECT 133.8 03/03/2012   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  Zetia  caused weakness  Now on repatha  140 mg every 14 d   Lab Results  Component Value Date   ALT 14 09/23/2024   AST 16 09/23/2024   ALKPHOS 72  09/23/2024   BILITOT 0.6 09/23/2024   Lab Results  Component Value Date   WBC 8.2 09/23/2024   HGB 13.9 09/23/2024   HCT 41.6 09/23/2024   MCV 88.9 09/23/2024   PLT 331.0 09/23/2024   Lab Results  Component Value Date   NA 139 09/23/2024   K 3.9 09/23/2024   CO2 33 (H) 09/23/2024   GLUCOSE 87 09/23/2024   BUN 11 09/23/2024   CREATININE 0.79 09/23/2024   CALCIUM 10.2 09/23/2024   GFR 70.42 09/23/2024   GFRNONAA 73.95 08/28/2010       Patient Active Problem List   Diagnosis Date Noted   Elevated TSH 02/28/2021   Osteopenia 04/29/2017   Estrogen deficiency 03/24/2017   Routine general medical examination at a health care facility 03/19/2016   Cerumen impaction 03/19/2016   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   Encounter for Medicare annual wellness exam 11/26/2013   Colon cancer screening 11/26/2013   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   Vitamin D  deficiency 01/04/2009   DISORDER, 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   FIBROIDS, UTERUS 03/16/2007   HYPERCHOLESTEROLEMIA 03/16/2007   Essential hypertension 03/16/2007   Allergic rhinitis 03/16/2007   FIBROCYSTIC BREAST DISEASE 03/16/2007   History of cardiovascular disorder 03/16/2007   Past Medical History:  Diagnosis Date   Allergy    allergic rhinitis seasonal   Anxiety    Ataxia    degenerative cerebellar   Degenerative arthritis     Left knee   Dyslipidemia    Gait disorder    Heart murmur    Hyperlipidemia    Hypertension    Neuromuscular disorder (HCC)    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   Past Surgical History:  Procedure Laterality Date   BREAST SURGERY     left breast needle biopsy negative   CHOLECYSTECTOMY     GALLBLADDER SURGERY     Social History   Tobacco Use   Smoking status: Former   Smokeless tobacco: Never  Vaping Use   Vaping status: Never Used  Substance Use Topics   Alcohol use: No    Comment: occasional   Drug use: No   Family History  Problem Relation Age of Onset   COPD Mother     Cancer Mother        breast Ca   Heart disease Father        CAD   Cancer Father        lung CA  Depression Daughter    Heart disease Maternal Grandmother        heart failure   Allergies  Allergen Reactions   Statins     Ataxia and weakness/myopathy    Zetia  [Ezetimibe ]     Weakness    Current Outpatient Medications on File Prior to Visit  Medication Sig Dispense Refill   acetaminophen (TYLENOL) 500 MG tablet Take 500-1,000 mg by mouth daily as needed. Do not exceed 3,000 mg per day (6 tablets).     bismuth subsalicylate (PEPTO BISMOL) 262 MG/15ML suspension Take 30 mLs by mouth daily as needed.     cholecalciferol (VITAMIN D ) 1000 units tablet Take 2,000 Units by mouth daily.     [employer] FIBER GUMMIES PO Take 4 tablets by mouth daily.     diphenhydrAMINE HCl ([employer] ALLERGY PO) Take 1 tablet by mouth daily as needed.     Evolocumab  (REPATHA  SURECLICK) 140 MG/ML SOAJ INJECT 140 MG INTO THE SKIN EVERY 14 (FOURTEEN) DAYS. 6 mL 1   hydrochlorothiazide  (HYDRODIURIL ) 25 MG tablet TAKE 1 TABLET EVERY DAY 90 tablet 0   metoprolol  succinate (TOPROL -XL) 25 MG 24 hr tablet TAKE 1 TABLET EVERY DAY 90 tablet 0   MULTIPLE VITAMIN PO Take by mouth daily.     No current facility-administered medications on file prior to visit.    Review of Systems  Constitutional:  Negative for activity change, appetite change, fatigue, fever and unexpected weight change.  HENT:  Negative for congestion, ear pain, rhinorrhea, sinus pressure and sore throat.   Eyes:  Negative for pain, redness and visual disturbance.  Respiratory:  Negative for cough, shortness of breath and wheezing.   Cardiovascular:  Negative for chest pain and palpitations.  Gastrointestinal:  Negative for abdominal pain, blood in stool, constipation and diarrhea.  Endocrine: Negative for polydipsia and polyuria.  Genitourinary:  Negative for dysuria, frequency and urgency.  Musculoskeletal:  Negative for arthralgias, back pain and  myalgias.  Skin:  Negative for pallor and rash.  Allergic/Immunologic: Negative for environmental allergies.  Neurological:  Positive for speech difficulty. Negative for dizziness, syncope and headaches.       Ataxia Baseline   Hematological:  Negative for adenopathy. Does not bruise/bleed easily.  Psychiatric/Behavioral:  Negative for decreased concentration and dysphoric mood. The patient is not nervous/anxious.        Objective:   Physical Exam Constitutional:      General: She is not in acute distress.    Appearance: Normal appearance. She is well-developed and normal weight. She is not ill-appearing or diaphoretic.  HENT:     Head: Normocephalic and atraumatic.     Right Ear: Tympanic membrane, ear canal and external ear normal. There is impacted cerumen.     Left Ear: Tympanic membrane, ear canal and external ear normal. There is impacted cerumen.     Nose: Nose normal. No congestion.     Mouth/Throat:     Mouth: Mucous membranes are moist.     Pharynx: Oropharynx is clear. No posterior oropharyngeal erythema.  Eyes:     General: No scleral icterus.    Extraocular Movements: Extraocular movements intact.     Conjunctiva/sclera: Conjunctivae normal.     Pupils: Pupils are equal, round, and reactive to light.  Neck:     Thyroid : No thyromegaly.     Vascular: No carotid bruit or JVD.  Cardiovascular:     Rate and Rhythm: Normal rate and regular rhythm.     Pulses: Normal pulses.  Heart sounds: Normal heart sounds.     No gallop.  Pulmonary:     Effort: Pulmonary effort is normal. No respiratory distress.     Breath sounds: Normal breath sounds. No wheezing.     Comments: Good air exch Chest:     Chest wall: No tenderness.  Abdominal:     General: Bowel sounds are normal. There is no distension or abdominal bruit.     Palpations: Abdomen is soft. There is no mass.     Tenderness: There is no abdominal tenderness.     Hernia: No hernia is present.  Genitourinary:     Comments: Declines breast exam    Musculoskeletal:        General: No tenderness. Normal range of motion.     Cervical back: Normal range of motion and neck supple. No rigidity. No muscular tenderness.     Right lower leg: No edema.     Left lower leg: No edema.     Comments: No kyphosis   Lymphadenopathy:     Cervical: No cervical adenopathy.  Skin:    General: Skin is warm and dry.     Coloration: Skin is not pale.     Findings: No bruising, erythema or rash.  Neurological:     Mental Status: She is alert. Mental status is at baseline.     Cranial Nerves: No cranial nerve deficit.     Sensory: No sensory deficit.     Motor: No abnormal muscle tone.     Coordination: Coordination normal.     Gait: Gait normal.     Deep Tendon Reflexes: Reflexes are normal and symmetric.     Comments: Wheel chair   Ataxia  Slow speech baseline  Some spacticity of feet/LEs  Psychiatric:        Mood and Affect: Mood normal.        Cognition and Memory: Cognition and memory normal.           Assessment & Plan:   Problem List Items Addressed This Visit       Cardiovascular and Mediastinum   Essential hypertension   bp in fair control at this time  BP Readings from Last 1 Encounters:  09/30/24 136/72   No changes needed Most recent labs reviewed  Disc lifstyle change with low sodium diet and exercise  Plan to continue hctz 25 mg daily  Metoprolol  xl 25 mg daily   Encouraged more exercise (seated) as tolerated         Nervous and Auditory   Spinocerebellar ataxia (HCC) (Chronic)   Fairly stable [facility] neuro care/reviewed note  Wheelchair much of the time Uses walker to get to the bathroom (frequently)   Less falls No injuries       Cerumen impaction   Bilateral  With some hearing loss Instructed to use debrox at least 2 times weekly  Follow up for ear irrigation in about a month        Musculoskeletal and Integument   Osteopenia (Chronic)   Dexa 12/2022 Some  falls (ataxia) , no fractures D level is normal  Encouraged more seated strength building exercise   Discussed fall prevention, supplements and exercise for bone density          Other   Vitamin D  deficiency   Last vitamin D  Lab Results  Component Value Date   VD25OH 62.07 09/23/2024   Encouraged to continue ca and D for bone and overall health  Routine general medical examination at a health care facility - Primary   Reviewed health habits including diet and exercise and skin cancer prevention Reviewed appropriate screening tests for age  Also reviewed health mt list, fam hx and immunization status , as well as social and family history   See HPI Labs reviewed and ordered Plans to get shingrix vaccine soon  Flu shot given  Declines breast and colon cancer screening  Dexa utd 12/2022 Discussed fall prevention, supplements and exercise for bone density  PHQ last was 0       HYPERCHOLESTEROLEMIA   Disc goals for lipids and reasons to control them Rev last labs with pt Rev low sat fat diet in detail Much improved with repatha   Pt continues working with pharm serv for pt assist Intol of zetia   Myopathy with statin (neurologically contraindicated)   LDL is down to 54  Repatha  140 mg every 14 days  Good HDL  Trig still high-discussed reducing dietary sugar and fat       Elevated TSH   TSH normal this check Lab Results  Component Value Date   TSH 3.56 09/23/2024         Colon cancer screening   Declines further colon cancer screening       Other Visit Diagnoses       Need for influenza vaccination       Relevant Orders   Flu vaccine HIGH DOSE PF(Fluzone Trivalent) (Completed)

## 2024-10-25 ENCOUNTER — Ambulatory Visit: Payer: Medicare PPO

## 2024-10-25 VITALS — BP 136/72 | Ht 63.0 in | Wt 151.0 lb

## 2024-10-25 DIAGNOSIS — Z0000 Encounter for general adult medical examination without abnormal findings: Secondary | ICD-10-CM | POA: Diagnosis not present

## 2024-10-25 NOTE — Progress Notes (Signed)
 I connected with  Jaime Mccarthy on 10/25/24 by a audio enabled telemedicine application and verified that I am speaking with the correct person using two identifiers.  Patient Location: Home  Provider Location: Home Office  Persons Participating in Visit: Patient.  I discussed the limitations of evaluation and management by telemedicine. The patient expressed understanding and agreed to proceed.  Vital Signs: Because this visit was a virtual/telehealth visit, some criteria may be missing or patient reported. Any vitals not documented were not able to be obtained and vitals that have been documented are patient reported.  Because this visit was a virtual/telehealth visit,  certain criteria was not obtained, such a blood pressure, CBG if applicable, and timed get up and go. Any medications not marked as taking were not mentioned during the medication reconciliation part of the visit. Any vitals not documented were not able to be obtained due to this being a telehealth visit or patient was unable to self-report a recent blood pressure reading due to a lack of equipment at home via telehealth. Vitals that have been documented are verbally provided by the patient.  This visit was performed by a medical professional under my direct supervision. I was immediately available for consultation/collaboration. I have reviewed and agree with the Annual Wellness Visit documentation.  Chief Complaint  Patient presents with   Medicare Wellness     Subjective:   Jaime Mccarthy is a 83 y.o. female who presents for a Medicare Annual Wellness Visit.  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   Past Surgical History:  Procedure Laterality Date   BREAST SURGERY     left breast needle biopsy negative   CHOLECYSTECTOMY     GALLBLADDER SURGERY     Family History  Problem Relation Age of Onset   COPD Mother    Cancer Mother        breast Ca   Heart disease Father        CAD   Cancer Father        lung CA   Depression Daughter    Heart disease Maternal Grandmother        heart failure   Social History   Occupational History    Employer: [employer]  Tobacco Use   Smoking status: Former   Smokeless tobacco: Never  Vaping Use   Vaping status: Never Used  Substance and Sexual Activity   Alcohol use: No    Comment: occasional   Drug use: No   Sexual activity: Not on file   Tobacco Counseling Counseling given: Not Answered  SDOH Screenings   Food Insecurity: No Food Insecurity (10/25/2024)  Housing: Low Risk  (10/25/2024)  Transportation Needs: No Transportation Needs (10/25/2024)  Utilities: Not At Risk (10/25/2024)  Alcohol Screen: Low Risk  (10/23/2023)  Depression (PHQ2-9): Low Risk  (10/25/2024)  Financial Resource Strain: Low Risk  (10/23/2023)  Physical Activity: Insufficiently Active (10/25/2024)  Social Connections: Moderately Isolated (10/25/2024)  Stress: No Stress Concern Present (10/25/2024)  Tobacco Use: Medium Risk (10/25/2024)  Health Literacy: Adequate Health Literacy (10/25/2024)  See flowsheets for full screening details  Depression Screen PHQ 2 & 9 Depression Scale- Over the past 2 weeks, how often have you been bothered by any of the following problems? Little interest or pleasure in doing things: 0 Feeling down, depressed, or hopeless (PHQ Adolescent also includes...irritable): 0 PHQ-2 Total Score: 0 Trouble falling or staying asleep, or sleeping too much: 0 Feeling tired or having little energy: 0 Poor appetite or overeating (PHQ Adolescent also includes...weight loss): 0 Feeling bad about  yourself - or that you are a failure or have let yourself or your family down: 0 Trouble concentrating on things, such as reading the newspaper or watching television (PHQ Adolescent also includes...like school work): 0 Moving or speaking so slowly that other people could have noticed. Or the opposite - being so fidgety or restless that you have been moving around a lot more than usual: 0 Thoughts that you would be better off dead, or of hurting yourself in some way: 0 PHQ-9 Total Score: 0 If you checked off any problems, how difficult have these problems made it for you to do your work, take care of things at home, or get along with other people?: Not difficult at all     Goals Addressed             This Visit's Progress    Patient Stated       To not fall down       Visit info / Clinical Intake: Medicare Wellness Visit Type:: Subsequent Annual Wellness Visit Persons participating in visit:: patient Medicare Wellness Visit Mode:: Telephone If telephone:: video declined Because this visit was a virtual/telehealth visit:: vitals recorded from last visit If Telephone or Video please confirm:: I connected with the patient using audio enabled telemedicine application and verified that I am speaking with the correct person using two identifiers; I discussed the limitations of evaluation and management by telemedicine; The patient expressed understanding and agreed to proceed Patient Location:: home Provider Location:: home office Information given by:: patient Interpreter Needed?: No Pre-visit prep was completed: yes AWV questionnaire completed by patient prior to visit?: no Living arrangements:: lives with spouse/significant other Patient's Overall Health Status Rating: very good Typical amount of pain: none Does pain affect daily life?: no Are you currently prescribed opioids?: no  Dietary Habits and Nutritional Risks How many meals a day?: 2 Eats fruit and vegetables daily?:  yes Most meals are obtained by: having others provide food; eating out In the last 2 weeks, have you had any of the following?: none Diabetic:: no  Functional Status Ambulation: Independent with device- listed below Home Assistive Devices/Equipment: Walker (specify Type) Medication Administration: Independent Home Management: Independent Manage your own finances?: (!) no Primary transportation is: family/friends Concerns about vision?: no *vision screening is required for WTM* Concerns about hearing?: no  Fall Screening Falls in the past year?: 0 Number of falls in past year: 0 Was there an injury with Fall?: 0 Fall Risk Category Calculator: 0 Patient Fall Risk Level: Low Fall Risk  Fall Risk Patient at Risk for Falls Due to: No Fall Risks Fall risk Follow up: Falls evaluation completed  Home and Transportation Safety: All rugs have non-skid backing?: N/A, no rugs All stairs or steps have railings?: N/A, no stairs Grab bars in the bathtub or shower?: yes Have non-skid surface in bathtub or shower?: yes Good home lighting?: yes Regular seat belt use?: yes Hospital stays in the last year:: no  Cognitive Assessment Difficulty concentrating, remembering, or  making decisions? : no Will 6CIT or Mini Cog be Completed: no  Advance Directives (For Healthcare) Does Patient Have a Medical Advance Directive?: Yes Does patient want to make changes to medical advance directive?: No - Patient declined Type of Advance Directive: Healthcare Power of [place]; Living will Copy of Healthcare Power of Attorney in Chart?: Yes - validated most recent copy scanned in chart (See row information) Copy of Living Will in Chart?: Yes - validated most recent copy scanned in chart (See row information)  Reviewed/Updated  Reviewed/Updated: Reviewed All (Medical, Surgical, Family, Medications, Allergies, Care Teams, Patient Goals)        Objective:    Today's Vitals   10/25/24 0831  BP:  136/72  Weight: 151 lb (68.5 kg)  Height: 5' 3 (1.6 m)   Body mass index is 26.75 kg/m.  Current Medications (verified) Outpatient Encounter Medications as of 10/25/2024  Medication Sig   acetaminophen (TYLENOL) 500 MG tablet Take 500-1,000 mg by mouth daily as needed. Do not exceed 3,000 mg per day (6 tablets).   bismuth subsalicylate (PEPTO BISMOL) 262 MG/15ML suspension Take 30 mLs by mouth daily as needed.   cholecalciferol (VITAMIN D ) 1000 units tablet Take 2,000 Units by mouth daily.   [employer] FIBER GUMMIES PO Take 4 tablets by mouth daily.   diphenhydrAMINE HCl ([employer] ALLERGY PO) Take 1 tablet by mouth daily as needed.   Evolocumab  (REPATHA  SURECLICK) 140 MG/ML SOAJ INJECT 140 MG INTO THE SKIN EVERY 14 (FOURTEEN) DAYS.   hydrochlorothiazide  (HYDRODIURIL ) 25 MG tablet TAKE 1 TABLET EVERY DAY   metoprolol  succinate (TOPROL -XL) 25 MG 24 hr tablet TAKE 1 TABLET EVERY DAY   MULTIPLE VITAMIN PO Take by mouth daily.   No facility-administered encounter medications on file as of 10/25/2024.   Hearing/Vision screen Hearing Screening - Comments:: No hearing Vision Screening - Comments:: Patient wears readers Immunizations and Health Maintenance Health Maintenance  Topic Date Due   Medicare Annual Wellness (AWV)  10/22/2024   Mammogram  09/30/2025 (Originally 01/23/2023)   COVID-19 Vaccine (4 - 2025-26 season) 10/16/2025 (Originally 08/02/2024)   DTaP/Tdap/Td (3 - Tdap) 03/12/2025   Pneumococcal Vaccine: 50+ Years  Completed   Influenza Vaccine  Completed   Bone Density Scan  Completed   Zoster Vaccines- Shingrix  Completed   Meningococcal B Vaccine  Aged Out   Colonoscopy  Discontinued   Hepatitis C Screening  Discontinued        Assessment/Plan:  This is a routine wellness examination for [place].  Patient Care Team: [place], Laine LABOR, MD as PCP - General Portia Fireman, [place] as Consulting Physician (Optometry)  I have personally reviewed and noted the following in the patient's  chart:   Medical and social history Use of alcohol, tobacco or illicit drugs  Current medications and supplements including opioid prescriptions. Functional ability and status Nutritional status Physical activity Advanced directives List of other physicians Hospitalizations, surgeries, and ER visits in previous 12 months Vitals Screenings to include cognitive, depression, and falls Referrals and appointments  No orders of the defined types were placed in this encounter.  In addition, I have reviewed and discussed with patient certain preventive protocols, quality metrics, and best practice recommendations. A written personalized care plan for preventive services as well as general preventive health recommendations were provided to patient.   Lyle MARLA Right, CMA   10/25/2024   No follow-ups on file.  After Visit Summary: (MyChart) Due to this being a telephonic visit, the after visit summary with patients personalized  plan was offered to patient via MyChart   Nurse Notes: nothing to report

## 2024-10-25 NOTE — Patient Instructions (Signed)
 Jaime Mccarthy,  Thank you for taking the time for your Medicare Wellness Visit. I appreciate your continued commitment to your health goals. Please review the care plan we discussed, and feel free to reach out if I can assist you further.  Please note that Annual Wellness Visits do not include a physical exam. Some assessments may be limited, especially if the visit was conducted virtually. If needed, we may recommend an in-person follow-up with your provider.  Ongoing Care Seeing your primary care provider every 3 to 6 months helps us  monitor your health and provide consistent, personalized care.  Referrals If a referral was made during today's visit and you haven't received any updates within two weeks, please contact the referred provider directly to check on the status.  Recommended Screenings:  Health Maintenance  Topic Date Due   Medicare Annual Wellness Visit  10/22/2024   Breast Cancer Screening  09/30/2025*   COVID-19 Vaccine (4 - 2025-26 season) 10/16/2025*   DTaP/Tdap/Td vaccine (3 - Tdap) 03/12/2025   Pneumococcal Vaccine for age over 71  Completed   Flu Shot  Completed   Osteoporosis screening with Bone Density Scan  Completed   Zoster (Shingles) Vaccine  Completed   Meningitis B Vaccine  Aged Out   Colon Cancer Screening  Discontinued   Hepatitis C Screening  Discontinued  *Topic was postponed. The date shown is not the original due date.       10/25/2024    8:35 AM  Advanced Directives  Does Patient Have a Medical Advance Directive? Yes  Type of Estate Agent of [place];Living will  Does patient want to make changes to medical advance directive? No - Patient declined  Copy of Healthcare Power of Attorney in Chart? Yes - validated most recent copy scanned in chart (See row information)    Vision: Annual vision screenings are recommended for early detection of glaucoma, cataracts, and diabetic retinopathy. These exams can also reveal signs of  chronic conditions such as diabetes and high blood pressure.  Dental: Annual dental screenings help detect early signs of oral cancer, gum disease, and other conditions linked to overall health, including heart disease and diabetes.  Please see the attached documents for additional preventive care recommendations.

## 2024-11-01 ENCOUNTER — Ambulatory Visit: Admitting: Family Medicine

## 2024-11-01 ENCOUNTER — Encounter: Payer: Self-pay | Admitting: Family Medicine

## 2024-11-01 VITALS — BP 132/70 | HR 68 | Temp 98.2°F | Ht 63.0 in

## 2024-11-01 DIAGNOSIS — H6123 Impacted cerumen, bilateral: Secondary | ICD-10-CM

## 2024-11-01 NOTE — Assessment & Plan Note (Signed)
 Resolved after bilateral simple irrigation with improved hearing Pt tolerated well and TMs appear normal   Encouraged to call if any ear pain or return of symptoms   Can use debrox drops periodically to keep cerumen more mobile

## 2024-11-01 NOTE — Patient Instructions (Signed)
 Ears look good after irrigation  Hearing should continue to improve as water drains out  If any ear pain or other symptoms please let us  know   You can use the debrox drops periodically to slow down the wax accumulation

## 2024-11-01 NOTE — Progress Notes (Signed)
 Subjective:    Patient ID: Jaime Mccarthy, female    DOB: September 01, 1941, 83 y.o.   MRN: [id number]  HPI  Wt Readings from Last 3 Encounters:  10/25/24 151 lb (68.5 kg)  03/02/24 151 lb (68.5 kg)  10/23/23 155 lb (70.3 kg)   26.75 kg/m  Vitals:   11/01/24 0839  BP: 132/70  Pulse: 68  Temp: 98.2 F (36.8 C)  SpO2: 98%    Pt presents for  Cerumen impaction/ ear irrigation   Used debrox for a week Made her ears too full so she stopped  Nothing came out on its own      Patient Active Problem List   Diagnosis Date Noted   Elevated TSH 02/28/2021   Osteopenia 04/29/2017   Estrogen deficiency 03/24/2017   Routine general medical examination at a health care facility 03/19/2016   Cerumen impaction 03/19/2016   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   Encounter for Medicare annual wellness exam 11/26/2013   Colon cancer screening 11/26/2013   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   Vitamin D  deficiency 01/04/2009   DISORDER, 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   FIBROIDS, UTERUS 03/16/2007   HYPERCHOLESTEROLEMIA 03/16/2007   Essential hypertension 03/16/2007   Allergic rhinitis 03/16/2007   FIBROCYSTIC BREAST DISEASE 03/16/2007   History of cardiovascular disorder 03/16/2007   Past Medical History:  Diagnosis Date   Allergy    allergic rhinitis seasonal   Anxiety    Ataxia    degenerative cerebellar   Degenerative arthritis     Left knee   Dyslipidemia    Gait disorder    Heart murmur    Hyperlipidemia    Hypertension    Neuromuscular disorder (HCC)    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   Past Surgical History:  Procedure Laterality Date   BREAST SURGERY     left breast needle biopsy negative   CHOLECYSTECTOMY     GALLBLADDER SURGERY     Social History   Tobacco Use   Smoking status: Former   Smokeless tobacco: Never  Vaping Use   Vaping status: Never Used  Substance Use Topics   Alcohol use: No    Comment: occasional   Drug use: No   Family History  Problem  Relation Age of Onset   COPD Mother    Cancer Mother        breast Ca   Heart disease Father        CAD   Cancer Father        lung CA   Depression Daughter    Heart disease Maternal Grandmother        heart failure   Allergies  Allergen Reactions   Statins     Ataxia and weakness/myopathy    Zetia  [Ezetimibe ]     Weakness    Current Outpatient Medications on File Prior to Visit  Medication Sig Dispense Refill   acetaminophen (TYLENOL) 500 MG tablet Take 500-1,000 mg by mouth daily as needed. Do not exceed 3,000 mg per day (6 tablets).     bismuth subsalicylate (PEPTO BISMOL) 262 MG/15ML suspension Take 30 mLs by mouth daily as needed.     cholecalciferol (VITAMIN D ) 1000 units tablet Take 2,000 Units by mouth daily.     [employer] FIBER GUMMIES PO Take 4 tablets by mouth daily.     diphenhydrAMINE HCl ([employer] ALLERGY PO) Take 1 tablet by mouth daily as needed.     Evolocumab  (REPATHA  SURECLICK) 140 MG/ML SOAJ INJECT 140 MG INTO THE SKIN EVERY 14 (FOURTEEN) DAYS.  6 mL 1   hydrochlorothiazide  (HYDRODIURIL ) 25 MG tablet TAKE 1 TABLET EVERY DAY 90 tablet 0   metoprolol  succinate (TOPROL -XL) 25 MG 24 hr tablet TAKE 1 TABLET EVERY DAY 90 tablet 0   MULTIPLE VITAMIN PO Take by mouth daily.     No current facility-administered medications on file prior to visit.    Review of Systems  HENT:  Positive for hearing loss. Negative for ear discharge, ear pain and facial swelling.        Ear fullness Cerumen impaction   Neurological:  Negative for dizziness.       Baseline ataxia with poor balance        Objective:   Physical Exam Constitutional:      General: She is not in acute distress.    Appearance: She is not ill-appearing.     Comments: In wheelchair   HENT:     Right Ear: External ear normal. There is impacted cerumen.     Left Ear: External ear normal. There is impacted cerumen.     Ears:     Comments: Procedure: Cerumen Disimpaction  After consent obtained  Warm water was  applied and gentle ear lavage performed on both ears.    There were no complications and following the disimpaction the tympanic membrane were visible on the bilateral. Tympanic membranes are intact following the procedure.  Auditory canals are normal.  The patient reported relief of symptoms after removal of cerumen.  Normal TMs Improved hearing  Pt tolerated well   Eyes:     Conjunctiva/sclera: Conjunctivae normal.     Pupils: Pupils are equal, round, and reactive to light.  Cardiovascular:     Rate and Rhythm: Normal rate.  Pulmonary:     Effort: No respiratory distress.  Musculoskeletal:     Cervical back: Neck supple.  Neurological:     Mental Status: She is alert.     Cranial Nerves: No cranial nerve deficit.  Psychiatric:        Mood and Affect: Mood normal.           Assessment & Plan:   Problem List Items Addressed This Visit       Nervous and Auditory   Cerumen impaction - Primary   Resolved after bilateral simple irrigation with improved hearing Pt tolerated well and TMs appear normal   Encouraged to call if any ear pain or return of symptoms   Can use debrox drops periodically to keep cerumen more mobile

## 2024-11-16 ENCOUNTER — Other Ambulatory Visit: Payer: Self-pay | Admitting: *Deleted

## 2024-11-16 DIAGNOSIS — I10 Essential (primary) hypertension: Secondary | ICD-10-CM

## 2024-11-16 MED ORDER — METOPROLOL SUCCINATE ER 25 MG PO TB24: 25.0000 mg | ORAL_TABLET | Freq: Every day | ORAL | 2 refills | Status: AC

## 2024-11-16 MED ORDER — HYDROCHLOROTHIAZIDE 25 MG PO TABS: 25.0000 mg | ORAL_TABLET | Freq: Every day | ORAL | 2 refills | Status: AC

## 2024-12-07 ENCOUNTER — Telehealth: Payer: Self-pay | Admitting: Pharmacist

## 2024-12-07 NOTE — Progress Notes (Signed)
 Brief Telephone Documentation Reason for Call: Patient's husband left message regarding question for pharmacist   Summary of Call: Patient notes [facility]  cost increased to ~$700 in Jan. Previously using Orthoptist at [facility].   Called [facility] to confirm billing details. Looks like patient's grant is good through 09/07/25. Order will be re-billed today.   Discussed with patient's husband who verbalizes understanding.

## 2025-03-02 ENCOUNTER — Ambulatory Visit: Admitting: Neurology

## 2025-09-26 ENCOUNTER — Other Ambulatory Visit

## 2025-10-03 ENCOUNTER — Encounter: Admitting: Family Medicine
